# Patient Record
Sex: Male | Born: 1955
Health system: Southern US, Academic
[De-identification: ages and names within clinical notes are randomized; demographics above are authoritative.]

## PROBLEM LIST (undated history)

## (undated) ENCOUNTER — Encounter

## (undated) ENCOUNTER — Encounter: Attending: Internal Medicine | Primary: Internal Medicine

## (undated) ENCOUNTER — Telehealth

## (undated) ENCOUNTER — Ambulatory Visit

## (undated) ENCOUNTER — Encounter: Attending: Urology | Primary: Urology

## (undated) ENCOUNTER — Telehealth: Attending: Children | Primary: Children

## (undated) ENCOUNTER — Ambulatory Visit: Payer: MEDICARE

## (undated) ENCOUNTER — Encounter: Attending: Family | Primary: Family

## (undated) ENCOUNTER — Encounter: Attending: Children | Primary: Children

## (undated) ENCOUNTER — Encounter: Attending: Nurse Practitioner | Primary: Nurse Practitioner

## (undated) ENCOUNTER — Encounter
Attending: Pharmacist Clinician (PhC)/ Clinical Pharmacy Specialist | Primary: Pharmacist Clinician (PhC)/ Clinical Pharmacy Specialist

## (undated) ENCOUNTER — Other Ambulatory Visit

## (undated) ENCOUNTER — Ambulatory Visit: Payer: MEDICARE | Attending: Internal Medicine | Primary: Internal Medicine

## (undated) ENCOUNTER — Telehealth: Attending: Medical Oncology | Primary: Medical Oncology

## (undated) ENCOUNTER — Telehealth: Attending: Internal Medicine | Primary: Internal Medicine

## (undated) ENCOUNTER — Ambulatory Visit
Attending: Pharmacist Clinician (PhC)/ Clinical Pharmacy Specialist | Primary: Pharmacist Clinician (PhC)/ Clinical Pharmacy Specialist

## (undated) ENCOUNTER — Encounter
Attending: Student in an Organized Health Care Education/Training Program | Primary: Student in an Organized Health Care Education/Training Program

## (undated) ENCOUNTER — Ambulatory Visit: Payer: MEDICARE | Attending: Family | Primary: Family

## (undated) ENCOUNTER — Ambulatory Visit: Attending: Urology | Primary: Urology

## (undated) ENCOUNTER — Encounter: Attending: Registered" | Primary: Registered"

## (undated) DIAGNOSIS — C801 Malignant (primary) neoplasm, unspecified: Secondary | ICD-10-CM

## (undated) DIAGNOSIS — F32A Depression, unspecified: Secondary | ICD-10-CM

## (undated) DIAGNOSIS — J45909 Unspecified asthma, uncomplicated: Secondary | ICD-10-CM

## (undated) DIAGNOSIS — G8929 Other chronic pain: Secondary | ICD-10-CM

## (undated) DIAGNOSIS — T753XXA Motion sickness, initial encounter: Secondary | ICD-10-CM

## (undated) DIAGNOSIS — M25512 Pain in left shoulder: Secondary | ICD-10-CM

## (undated) DIAGNOSIS — M25551 Pain in right hip: Secondary | ICD-10-CM

## (undated) DIAGNOSIS — I219 Acute myocardial infarction, unspecified: Secondary | ICD-10-CM

## (undated) DIAGNOSIS — I10 Essential (primary) hypertension: Secondary | ICD-10-CM

## (undated) DIAGNOSIS — D649 Anemia, unspecified: Secondary | ICD-10-CM

## (undated) DIAGNOSIS — R011 Cardiac murmur, unspecified: Secondary | ICD-10-CM

## (undated) DIAGNOSIS — A159 Respiratory tuberculosis unspecified: Secondary | ICD-10-CM

## (undated) DIAGNOSIS — F329 Major depressive disorder, single episode, unspecified: Secondary | ICD-10-CM

## (undated) DIAGNOSIS — E785 Hyperlipidemia, unspecified: Secondary | ICD-10-CM

## (undated) DIAGNOSIS — K219 Gastro-esophageal reflux disease without esophagitis: Secondary | ICD-10-CM

## (undated) DIAGNOSIS — R7303 Prediabetes: Secondary | ICD-10-CM

## (undated) DIAGNOSIS — M1990 Unspecified osteoarthritis, unspecified site: Secondary | ICD-10-CM

## (undated) DIAGNOSIS — G4730 Sleep apnea, unspecified: Secondary | ICD-10-CM

## (undated) HISTORY — DX: Hyperlipidemia, unspecified: E78.5

## (undated) HISTORY — PX: TONSILECTOMY, ADENOIDECTOMY, BILATERAL MYRINGOTOMY AND TUBES: SHX2538

## (undated) HISTORY — DX: Essential (primary) hypertension: I10

## (undated) HISTORY — PX: CLAVICLE EXCISION: SUR503

## (undated) HISTORY — PX: TONSILLECTOMY: SUR1361

## (undated) MED ORDER — B-COMPLEX WITH VITAMIN C TABLET: ORAL | 0 days

---

## 2017-05-02 DIAGNOSIS — C801 Malignant (primary) neoplasm, unspecified: Secondary | ICD-10-CM

## 2017-05-02 HISTORY — DX: Malignant (primary) neoplasm, unspecified: C80.1

## 2017-05-16 ENCOUNTER — Other Ambulatory Visit: Payer: Self-pay

## 2017-06-02 ENCOUNTER — Encounter: Payer: Self-pay | Admitting: Urology

## 2017-06-02 ENCOUNTER — Ambulatory Visit (INDEPENDENT_AMBULATORY_CARE_PROVIDER_SITE_OTHER): Payer: Federal, State, Local not specified - PPO | Admitting: Urology

## 2017-06-02 VITALS — BP 123/68 | HR 81 | Ht 73.0 in | Wt 207.4 lb

## 2017-06-02 DIAGNOSIS — R972 Elevated prostate specific antigen [PSA]: Secondary | ICD-10-CM

## 2017-06-02 NOTE — Progress Notes (Signed)
06/02/2017 2:51 PM   Brendan Larson [birth year] [id number]  Referring provider: Maryland Pink, MD [address] [facility] [place], [place] [address]  Chief Complaint  Patient presents with  . Elevated PSA    HPI: The patient is a 62 year old gentleman who for evaluation of elevated PSA at 46.54 in January 2019.  He believes his PSA was approved around warned about 8 years ago.  He has no family history of prostate cancer.  He is never had a prostate biopsy before.  I PSS score is 9/2.  Not interested in any medications for his urinary symptoms.  No hematuria, UTIs, or nephrolithiasis.   PMH: Past Medical History:  Diagnosis Date  . Hyperlipidemia   . Hypertension     Surgical History: None  Home Medications:  Allergies as of 06/02/2017      Reactions   Penicillins Rash      Medication List        Accurate as of 06/02/17  2:51 PM. Always use your most recent med list.          lisinopril-hydrochlorothiazide 10-12.5 MG tablet Commonly known as:  PRINZIDE,ZESTORETIC Take by mouth.   multivitamin capsule Take by mouth.   nystatin-triamcinolone cream Commonly known as:  MYCOLOG II Apply topically.   omega-3 acid ethyl esters 1 g capsule Commonly known as:  LOVAZA Take by mouth 2 (two) times daily.   omeprazole 20 MG capsule Commonly known as:  PRILOSEC Take by mouth.   rosuvastatin 10 MG tablet Commonly known as:  CRESTOR Take by mouth.       Allergies:  Allergies  Allergen Reactions  . Penicillins Rash    Family History: Family History  Problem Relation Age of Onset  . Prostate cancer Neg Hx   . Bladder Cancer Neg Hx   . Kidney cancer Neg Hx     Social History:  reports that he has been smoking.  he has never used smokeless tobacco. He reports that he drinks alcohol. He reports that he does not use drugs.  ROS: UROLOGY Frequent Urination?: No Hard to postpone urination?: No Burning/pain with urination?: No Get up at night to  urinate?: Yes Leakage of urine?: No Urine stream starts and stops?: No Trouble starting stream?: Yes Do you have to strain to urinate?: No Blood in urine?: No Urinary tract infection?: No Sexually transmitted disease?: No Injury to kidneys or bladder?: No Painful intercourse?: No Weak stream?: Yes Erection problems?: No Penile pain?: No  Gastrointestinal Nausea?: No Vomiting?: No Indigestion/heartburn?: No Diarrhea?: No Constipation?: No  Constitutional Fever: No Night sweats?: No Weight loss?: No Fatigue?: Yes  Skin Skin rash/lesions?: Yes Itching?: No  Eyes Blurred vision?: No Double vision?: No  Ears/Nose/Throat Sore throat?: No Sinus problems?: No  Hematologic/Lymphatic Swollen glands?: No Easy bruising?: No  Cardiovascular Leg swelling?: No Chest pain?: No  Respiratory Cough?: Yes Shortness of breath?: No  Endocrine Excessive thirst?: No  Musculoskeletal Back pain?: No Joint pain?: Yes  Neurological Headaches?: No Dizziness?: Yes  Psychologic Depression?: No Anxiety?: No  Physical Exam: BP 123/68 (BP Location: Right Arm, Patient Position: Sitting, Cuff Size: Large)   Pulse 81   Ht 6\' 1"  (1.854 m)   Wt 207 lb 6.4 oz (94.1 kg)   BMI 27.36 kg/m   Constitutional:  Alert and oriented, No acute distress. HEENT: [place] AT, moist mucus membranes.  Trachea midline, no masses. Cardiovascular: No clubbing, cyanosis, or edema. Respiratory: Normal respiratory effort, no increased work of breathing. GI:  Abdomen is soft, nontender, nondistended, no abdominal masses GU: No CVA tenderness.  Normal phallus.  Testicles descended bilaterally benign.  DRE: 2+.  There is a nodule on the left lateral apex.  Is approximately 1-2 cm in size. Skin: No rashes, bruises or suspicious lesions. Lymph: No cervical or inguinal adenopathy. Neurologic: Grossly intact, no focal deficits, moving all 4 extremities. Psychiatric: Normal mood and affect.  Laboratory  Data: No results found for: WBC, HGB, HCT, MCV, PLT  No results found for: CREATININE  No results found for: PSA  No results found for: TESTOSTERONE  No results found for: HGBA1C  Urinalysis No results found for: COLORURINE, APPEARANCEUR, LABSPEC, PHURINE, GLUCOSEU, HGBUR, BILIRUBINUR, KETONESUR, PROTEINUR, UROBILINOGEN, NITRITE, LEUKOCYTESUR   Assessment & Plan:    1. Elevated PSA with abnormal DRE I discussed with the patient that he has both a markedly elevated PSA as well as an abnormal digital rectal exam.  His DRE was not is abnormal is at expected to be for how high his PSA is however there clearly is a nodule.  We did discuss the next step would be a prostate biopsy.  We discussed the risk, benefits, and indications of this procedure.  He understands the risks include but are not limited to bleeding and infection.  He knows to expect both blood in his stool and urine for up to 48 hours and semen for 6 weeks.  He understands the risk of hospitalization with sepsis despite periprocedure antibiotics of approximately 1%.  The patient is requesting that we repeat his PSA.  I offered him this test.  We did discuss however that even if his PSA normalizes that he should still undergo a biopsy due to his abnormal digital rectal exam.  No Follow-up on file.  [name], MD  [facility] [address], [place], [place] [address] [phone]

## 2017-06-03 LAB — PSA: Prostate Specific Ag, Serum: 68.9 ng/mL — ABNORMAL HIGH (ref 0.0–4.0)

## 2017-06-12 ENCOUNTER — Telehealth: Payer: Self-pay

## 2017-06-12 NOTE — Telephone Encounter (Signed)
Brendan Retort, MD  Lestine Box, LPN        Please let patient know PSA remains extremely elevated. Need to proceed with biopsy. Thanks   Spoke with pt in reference to PSA and bx. Pt voiced understanding.

## 2017-06-21 ENCOUNTER — Telehealth: Payer: Self-pay

## 2017-06-21 NOTE — Telephone Encounter (Signed)
Pt called stating he took ibuprofen, 400mg , over the weekend of a headache. Pt inquired about needing to reschedule bx. Per Dr. Pilar Jarvis pt needs to reschedule. Pt voiced understanding. Bx and results appts rescheduled.

## 2017-06-22 ENCOUNTER — Other Ambulatory Visit: Payer: Self-pay

## 2017-07-06 ENCOUNTER — Encounter: Payer: Self-pay | Admitting: Urology

## 2017-07-06 ENCOUNTER — Ambulatory Visit: Payer: Self-pay

## 2017-07-06 ENCOUNTER — Ambulatory Visit: Payer: Federal, State, Local not specified - PPO | Admitting: Urology

## 2017-07-06 ENCOUNTER — Other Ambulatory Visit: Payer: Self-pay | Admitting: Urology

## 2017-07-06 VITALS — BP 121/76 | HR 83 | Ht 73.0 in | Wt 208.8 lb

## 2017-07-06 DIAGNOSIS — R972 Elevated prostate specific antigen [PSA]: Secondary | ICD-10-CM

## 2017-07-06 MED ORDER — LEVOFLOXACIN 500 MG PO TABS
500.0000 mg | ORAL_TABLET | Freq: Once | ORAL | Status: AC
  Administered 2017-07-06: 500 mg via ORAL

## 2017-07-06 MED ORDER — LIDOCAINE HCL 2 % EX GEL
1.0000 "application " | Freq: Once | CUTANEOUS | Status: AC
  Administered 2017-07-06: 1 via URETHRAL

## 2017-07-06 MED ORDER — GENTAMICIN SULFATE 40 MG/ML IJ SOLN
80.0000 mg | Freq: Once | INTRAMUSCULAR | Status: AC
  Administered 2017-07-06: 80 mg via INTRAMUSCULAR

## 2017-07-06 NOTE — Progress Notes (Signed)
Prostate Biopsy Procedure   Informed consent was obtained after discussing risks/benefits of the procedure.  A time out was performed to ensure correct patient identity.  Pre-Procedure: - Last PSA Level: 68.89 - Gentamicin given prophylactically - Levaquin 500 mg administered PO -Transrectal Ultrasound performed revealing a 38 gm prostate -No significant hypoechoic or median lobe noted  Procedure: - Prostate block performed using 10 cc 1% lidocaine and biopsies taken from sextant areas, a total of 12 under ultrasound guidance.  Post-Procedure: - Patient tolerated the procedure well - He was counseled to seek immediate medical attention if experiences any severe pain, significant bleeding, or fevers - Return in one week to discuss biopsy results

## 2017-07-13 ENCOUNTER — Other Ambulatory Visit: Payer: Self-pay | Admitting: Urology

## 2017-07-13 ENCOUNTER — Ambulatory Visit: Payer: Self-pay | Admitting: Urology

## 2017-07-13 LAB — PATHOLOGY REPORT

## 2017-07-21 ENCOUNTER — Encounter: Payer: Self-pay | Admitting: Urology

## 2017-07-21 ENCOUNTER — Ambulatory Visit (INDEPENDENT_AMBULATORY_CARE_PROVIDER_SITE_OTHER): Payer: Federal, State, Local not specified - PPO | Admitting: Urology

## 2017-07-21 VITALS — BP 115/66 | HR 71 | Resp 16 | Ht 72.5 in | Wt 210.1 lb

## 2017-07-21 DIAGNOSIS — C61 Malignant neoplasm of prostate: Secondary | ICD-10-CM

## 2017-07-21 NOTE — Progress Notes (Signed)
07/21/2017 12:16 PM   Brendan Larson 07/12/1955 [id number]  Referring provider: Maryland Pink, MD [address] [facility] [place], [place] [address]  Chief Complaint  Patient presents with  . Follow-up    HPI: The patient is 62 year old gentleman presents today for prostate biopsy results.  Unfortunately, he had Gleason 4+3 = 7 in 6 of 12 cores all on the left side.  His PSA at time of diagnosis was 68.9.  His DRE did have a nodule on the left side that was approximately 1-2 cm.  Pathology: Gleason 4+3 = 7 prostate cancer in 1 core at LLA (74%) Gleason 3+4 = 7 prostate in 5 cores in remainder of left prostate (42%-92%) PSA at time of diagnosis: 68.9 DRE: 1-2 cm nodule left apex  Patient with no urological complaints currently.  He voids well with a good stream.  He has no problems with erectile dysfunction.  He has no bone pain particularly in the spine or pelvis.   PMH: Past Medical History:  Diagnosis Date  . Hyperlipidemia   . Hypertension     Surgical History: Past Surgical History:  Procedure Laterality Date  . TONSILECTOMY, ADENOIDECTOMY, BILATERAL MYRINGOTOMY AND TUBES      Home Medications:  Allergies as of 07/21/2017      Reactions   Penicillins Rash      Medication List        Accurate as of 07/21/17 12:16 PM. Always use your most recent med list.          lisinopril-hydrochlorothiazide 10-12.5 MG tablet Commonly known as:  PRINZIDE,ZESTORETIC Take by mouth.   multivitamin capsule Take by mouth.   nystatin-triamcinolone cream Commonly known as:  MYCOLOG II Apply topically.   omega-3 acid ethyl esters 1 g capsule Commonly known as:  LOVAZA Take by mouth 2 (two) times daily.   omeprazole 20 MG capsule Commonly known as:  PRILOSEC Take by mouth.   pantoprazole 40 MG tablet Commonly known as:  PROTONIX Take by mouth.   rosuvastatin 10 MG tablet Commonly known as:  CRESTOR Take by mouth.       Allergies:  Allergies    Allergen Reactions  . Penicillins Rash    Family History: Family History  Problem Relation Age of Onset  . Prostate cancer Neg Hx   . Bladder Cancer Neg Hx   . Kidney cancer Neg Hx     Social History:  reports that he has been smoking.  He has never used smokeless tobacco. He reports that he drinks alcohol. He reports that he does not use drugs.  ROS:                                        Physical Exam: BP 115/66   Pulse 71   Resp 16   Ht 6' 0.5" (1.842 m)   Wt 210 lb 1.6 oz (95.3 kg)   SpO2 98%   BMI 28.10 kg/m   Constitutional:  Alert and oriented, No acute distress. HEENT: [place] AT, moist mucus membranes.  Trachea midline, no masses. Cardiovascular: No clubbing, cyanosis, or edema. Respiratory: Normal respiratory effort, no increased work of breathing. GI: Abdomen is soft, nontender, nondistended, no abdominal masses GU: No CVA tenderness.  Skin: No rashes, bruises or suspicious lesions. Lymph: No cervical or inguinal adenopathy. Neurologic: Grossly intact, no focal deficits, moving all 4 extremities. Psychiatric: Normal mood and affect.  Laboratory Data: No results found for: WBC, HGB, HCT, MCV, PLT  No results found for: CREATININE  No results found for: PSA  No results found for: TESTOSTERONE  No results found for: HGBA1C  Urinalysis No results found for: COLORURINE, APPEARANCEUR, LABSPEC, PHURINE, GLUCOSEU, HGBUR, BILIRUBINUR, KETONESUR, PROTEINUR, UROBILINOGEN, NITRITE, LEUKOCYTESUR   Assessment & Plan:    1.  High risk prostate cancer I discussed with the patient that he has a new diagnosis of high risk prostate cancer.  This is being driven by his PSA of 68.9.  We did discuss the next step would be imaging with CT scan and bone scan to determine if he has metastatic disease.  We did discuss the algorithm in great detail based on imaging results.  If he does not have sign of metastatic disease, he would be a candidate for either  robotic prostatectomy or radiation therapy.  We discussed these treatment modalities in great detail.  We also discussed the side effects of each treatment option.  If he does have metastatic disease, we did discuss androgen deprivation therapy as first-line treatment.  We discussed the risks and benefits of this.  We discussed the side effects of androgen deprivation therapy including osteoporosis, fatigue, muscle loss, decreased libido, and erectile dysfunction.  All questions were answered.  The patient will follow-up after undergoing imaging studies.  Return for after CT and bone scan.  [name], MD  [facility] [address] [place], [place] [address] [phone]

## 2017-07-28 ENCOUNTER — Ambulatory Visit
Admission: RE | Admit: 2017-07-28 | Discharge: 2017-07-28 | Disposition: A | Discharge status: Home / Self Care | Payer: Federal, State, Local not specified - PPO | Source: Ambulatory Visit | Attending: Urology | Admitting: Urology

## 2017-07-28 ENCOUNTER — Encounter
Admission: RE | Admit: 2017-07-28 | Discharge: 2017-07-28 | Disposition: A | Discharge status: Home / Self Care | Payer: Federal, State, Local not specified - PPO | Source: Ambulatory Visit | Attending: Urology | Admitting: Urology

## 2017-07-28 DIAGNOSIS — G8929 Other chronic pain: Secondary | ICD-10-CM

## 2017-07-28 DIAGNOSIS — K760 Fatty (change of) liver, not elsewhere classified: Secondary | ICD-10-CM | POA: Diagnosis not present

## 2017-07-28 DIAGNOSIS — K8020 Calculus of gallbladder without cholecystitis without obstruction: Secondary | ICD-10-CM | POA: Insufficient documentation

## 2017-07-28 DIAGNOSIS — C61 Malignant neoplasm of prostate: Secondary | ICD-10-CM

## 2017-07-28 DIAGNOSIS — M899 Disorder of bone, unspecified: Secondary | ICD-10-CM | POA: Insufficient documentation

## 2017-07-28 DIAGNOSIS — M25551 Pain in right hip: Secondary | ICD-10-CM

## 2017-07-28 DIAGNOSIS — M25512 Pain in left shoulder: Secondary | ICD-10-CM

## 2017-07-28 HISTORY — DX: Pain in right hip: M25.551

## 2017-07-28 HISTORY — DX: Other chronic pain: G89.29

## 2017-07-28 HISTORY — DX: Pain in left shoulder: M25.512

## 2017-07-28 LAB — POCT I-STAT CREATININE: Creatinine, Ser: 0.9 mg/dL (ref 0.61–1.24)

## 2017-07-28 IMAGING — NM NM BONE WHOLE BODY
2 series · 12 of 12 positions shown · non-contrast
Comparison: CT [DATE]

CLINICAL DATA: Prostate cancer

EXAM:
NUCLEAR MEDICINE WHOLE BODY BONE SCAN
TECHNIQUE: Whole body anterior and posterior images were obtained approximately
3 hours after intravenous injection of radiopharmaceutical.
RADIOPHARMACEUTICALS:  22.89 mCi [KY] MDP IV

[Series 1000: statics · 2.40mm/px · 5 acquisitions, 10 frames shown]
[im 1/5]
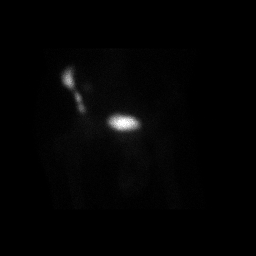
[im 1/5]
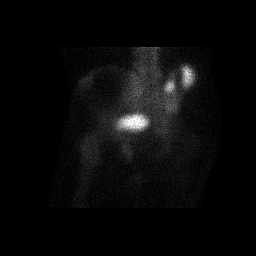
[im 2/5]
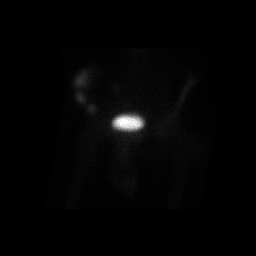
[im 2/5]
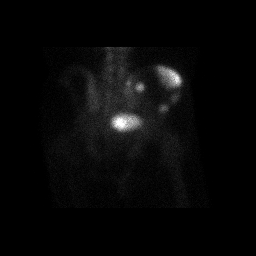
[im 3/5]
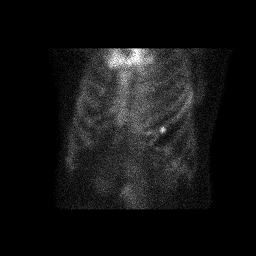
[im 3/5]
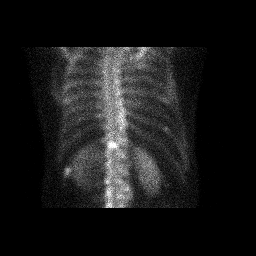
[im 4/5]
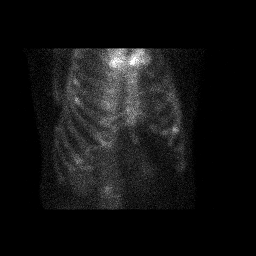
[im 4/5]
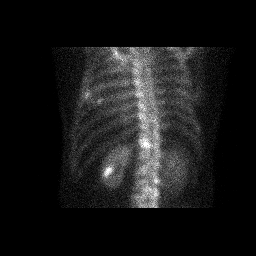
[im 5/5]
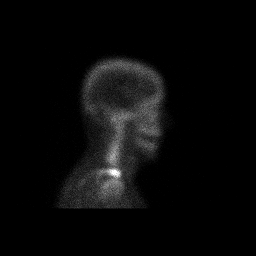
[im 5/5]
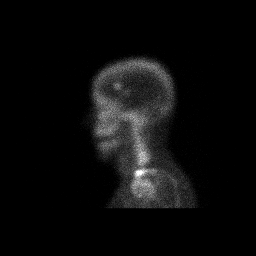

[Series 1000: 3 hr wholebody · 2.40mm/px · 2 of 2 frames shown]
[frame 1/2]
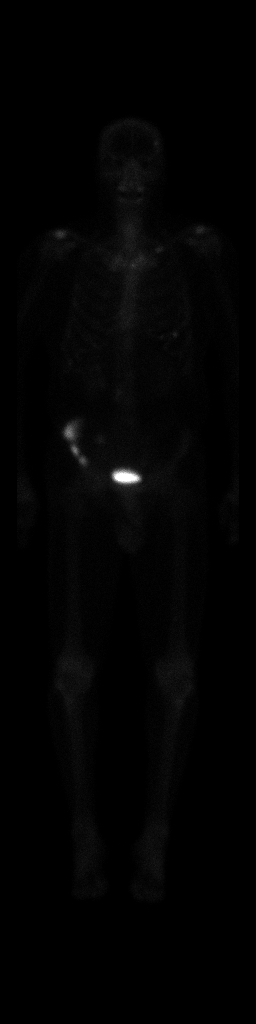
[frame 2/2]
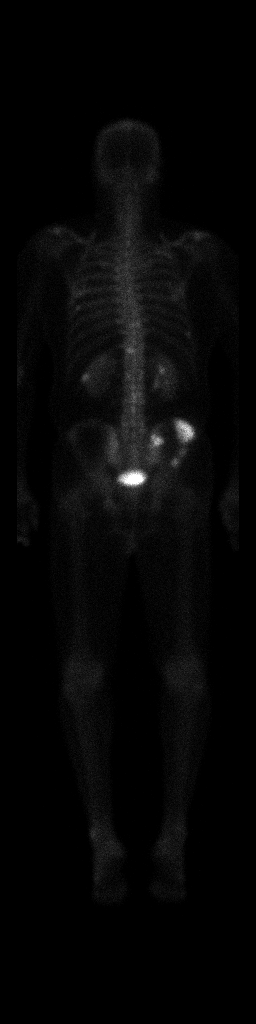

[12 of 12 positions shown; findings below may reference images not displayed]

FINDINGS: Small focus of left calvarial activity. Mild right greater than left
AC joint activity possibly degenerative. Mild upper sternal
activity. Multiple bilateral foci of rib activity, on the left at
the seventh, eighth, and twelfth ribs, and on the right at the tenth
and fourth posterior ribs. Small focus of activity in the region of
medial left clavicle versus first rib. Small foci of activity within
the lower thoracic spine. Intense multiple foci of activity in the
right iliac bone, and adjacent to the SI joint corresponding to CT
lesions. Physiologic renal and bladder activity. Mild degenerative
activity at the left ankle.
IMPRESSION: 1. Multiple foci of osseous activity involving the right pelvis,
bilateral ribs, spine, sternum and left calvarium consistent with
osseous metastatic disease. Small focus of activity either involving
the left anterior first rib or medial left clavicle.
2. Probable degenerative activity at the AC joints and left ankle.

## 2017-07-28 IMAGING — CT CT ABD-PELV W/ CM
2 of 5 series · 15 of 46 positions shown, 17 images · IV contrast (APPLIED)
Comparison: None.

CLINICAL DATA: Prostate cancer.

EXAM:
CT ABDOMEN AND PELVIS WITH CONTRAST
TECHNIQUE: Multidetector CT imaging of the abdomen and pelvis was performed
using the standard protocol following bolus administration of
intravenous contrast.
CONTRAST:  100mL [ZI] IOPAMIDOL ([ZI]) INJECTION 61%

[Series 2: routine abd/pel with · axial · 0.76mm/px · z∈[-1115,-635]mm · 12 of 109 slices shown, 14 images]
[im 7/109  soft-tissue]
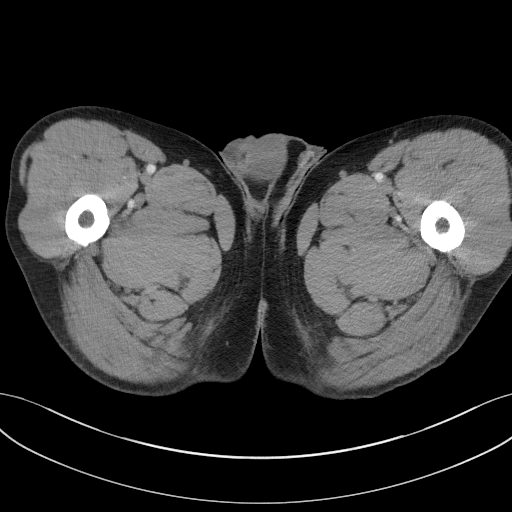
[im 7/109  bone]
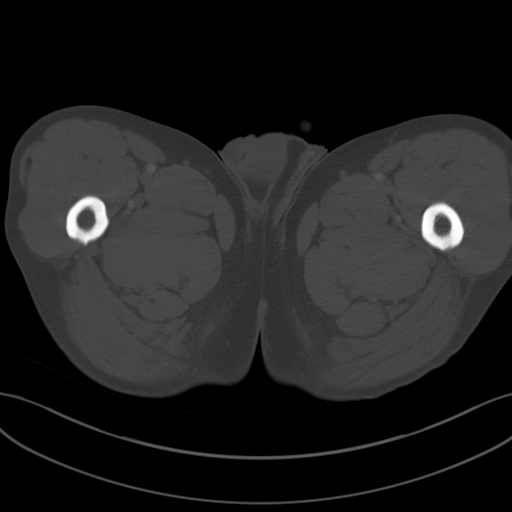
[im 19/109  soft-tissue]
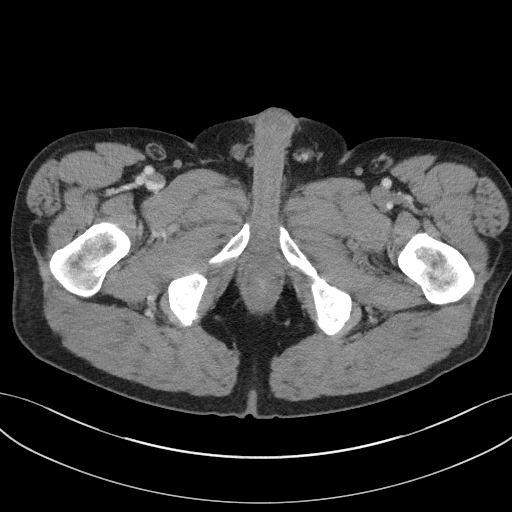
[im 25/109  soft-tissue]
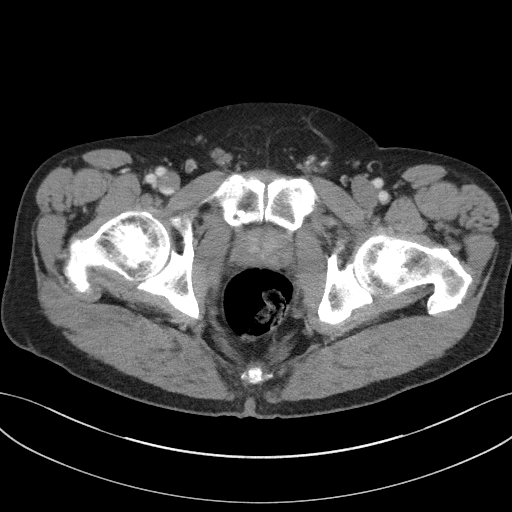
[im 31/109  soft-tissue]
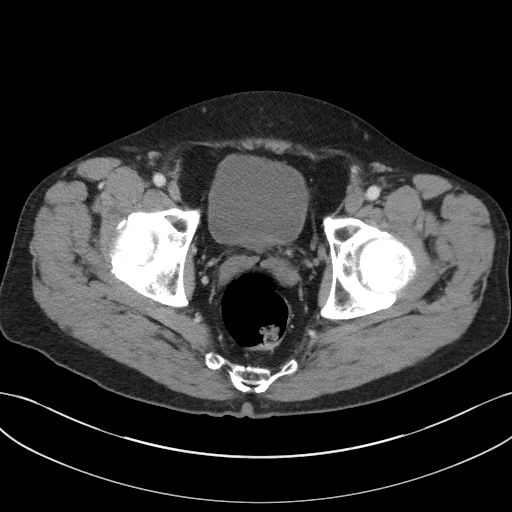
[im 43/109  soft-tissue]
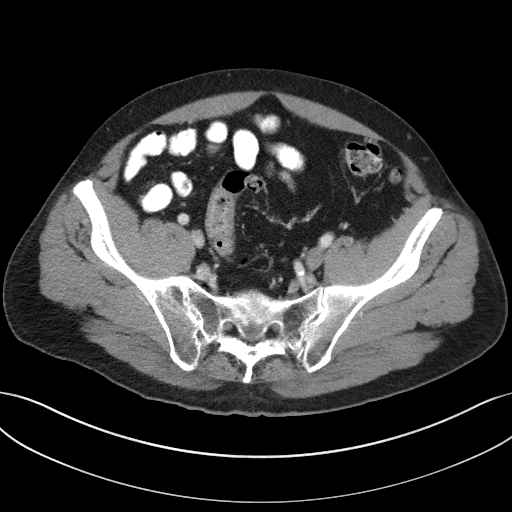
[im 49/109  soft-tissue]
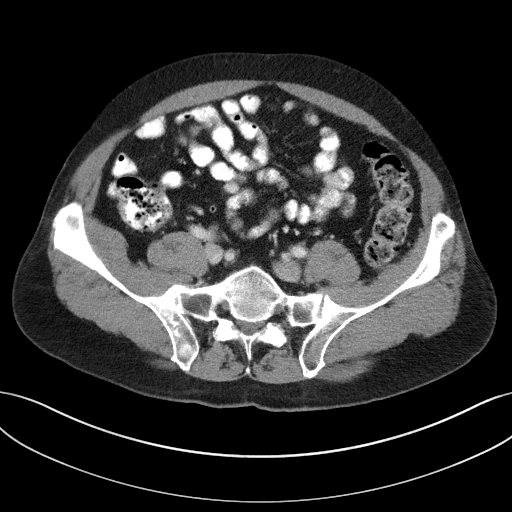
[im 61/109  soft-tissue]
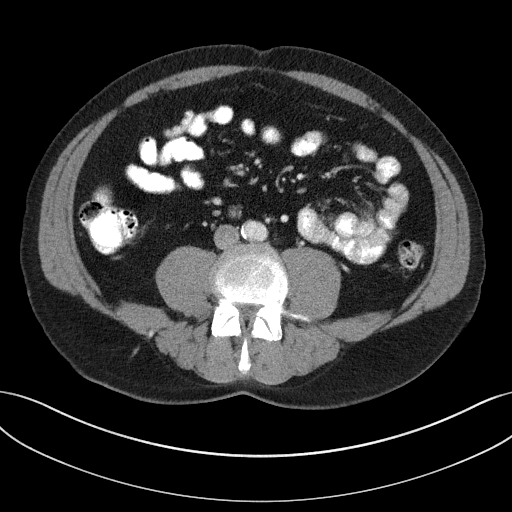
[im 67/109  soft-tissue]
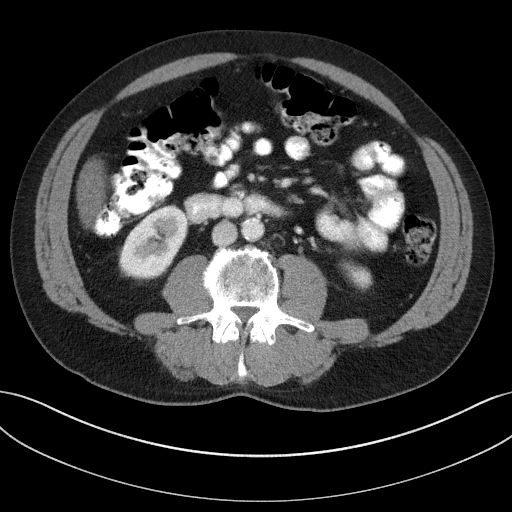
[im 79/109  soft-tissue]
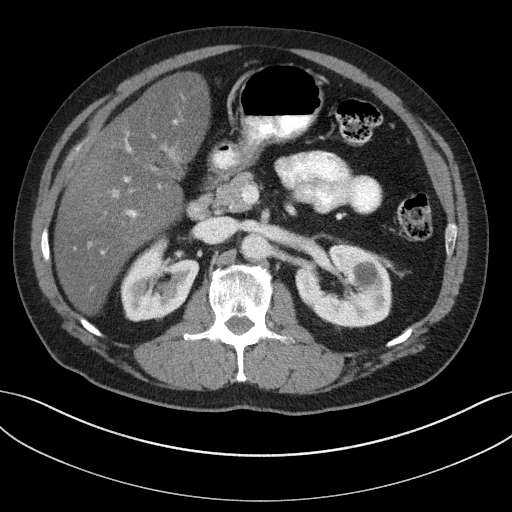
[im 79/109  bone]
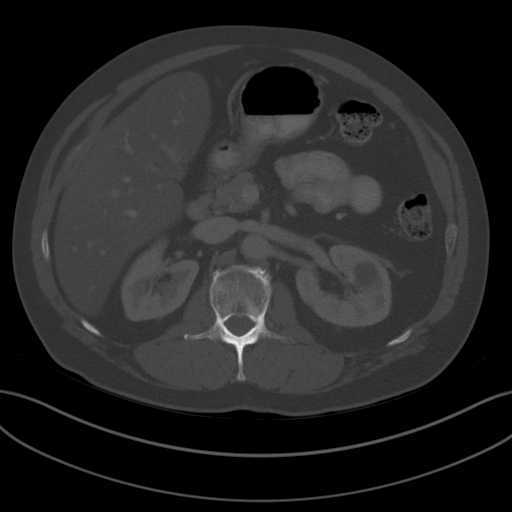
[im 85/109  soft-tissue]
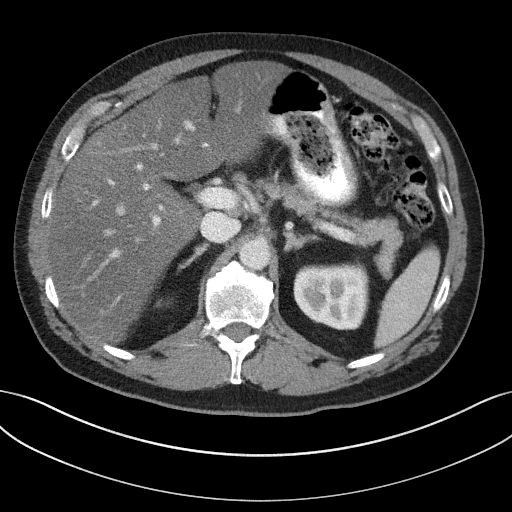
[im 91/109  soft-tissue]
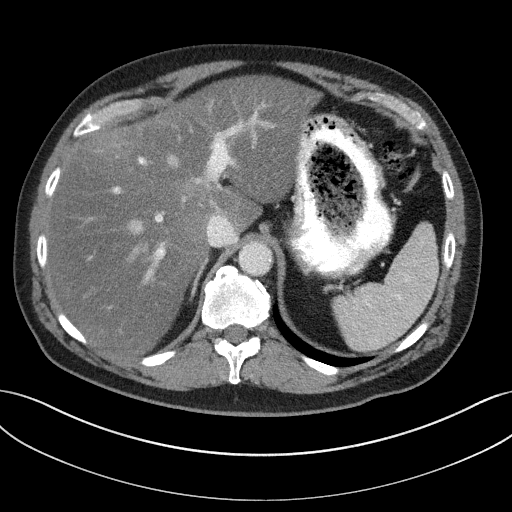
[im 103/109  soft-tissue]
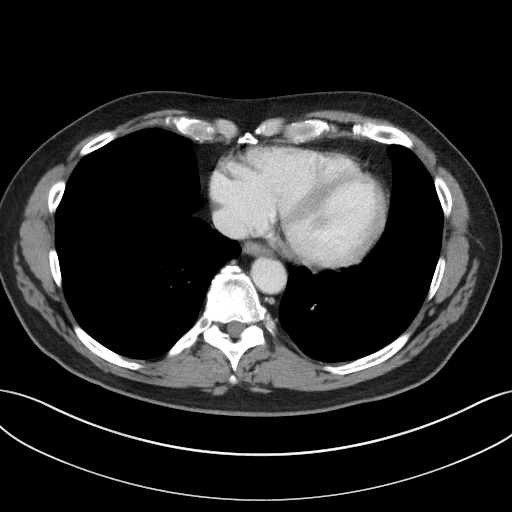

[Series 5: coronal st · coronal · 0.77mm/px · 3 of 84 slices shown]
[im 28/84  soft-tissue]
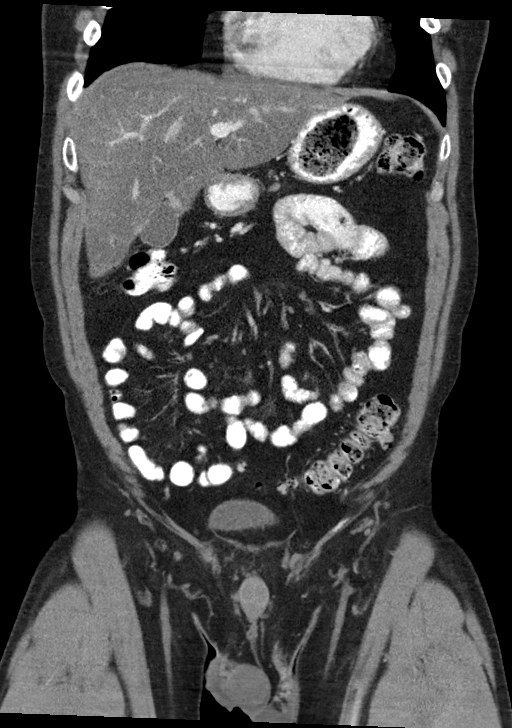
[im 37/84  soft-tissue]
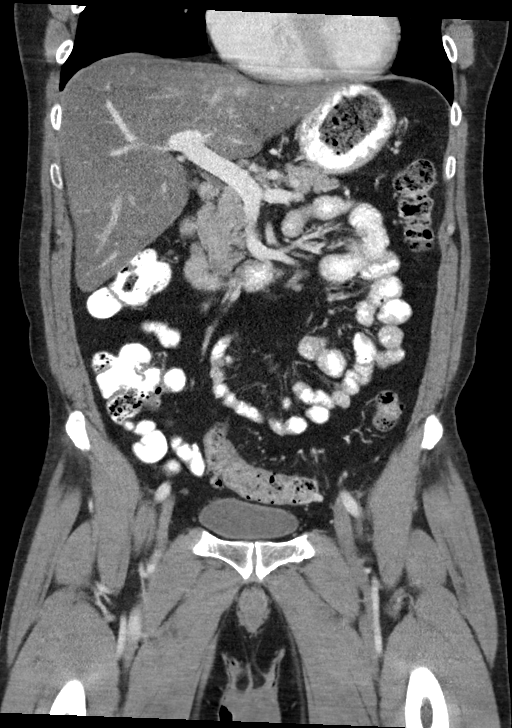
[im 47/84  soft-tissue]
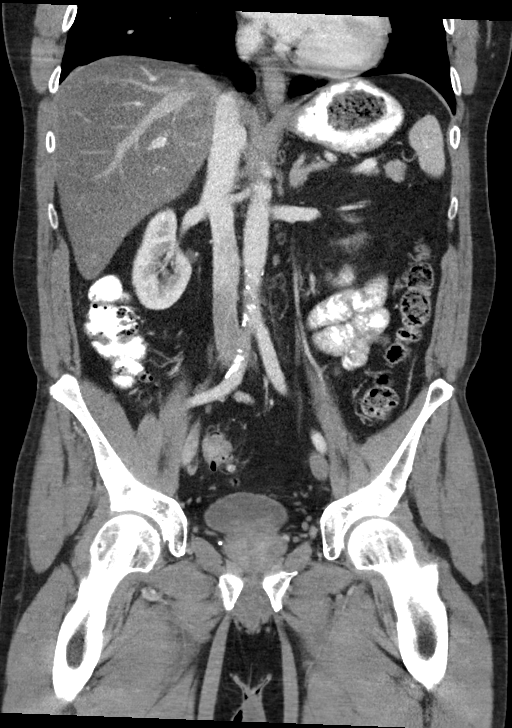

[15 of 46 positions shown; findings below may reference images not displayed]

FINDINGS: Lower chest: The lung bases are clear of acute process. No pleural
effusion or pulmonary lesions. The heart is normal in size. No
pericardial effusion. The distal esophagus and aorta are
unremarkable.

Hepatobiliary: Diffuse and marked fatty infiltration of the liver.
No focal hepatic lesions or intrahepatic biliary dilatation. The
portal and hepatic veins are patent. Small gallstones are noted in
the gallbladder but no findings for acute cholecystitis. No common
bile duct dilatation.

Pancreas: No mass, inflammation or ductal dilatation.

Spleen: Normal size.  No focal lesions.

Adrenals/Urinary Tract: The adrenal glands and kidneys are
unremarkable. Renal cysts are noted. No worrisome renal lesions. No
hydronephrosis. No ureteral or bladder calculi. No bladder mass. The
delayed images do not demonstrate any significant collecting system
abnormalities.

Stomach/Bowel: The stomach, duodenum, small bowel and colon are
unremarkable. No acute inflammatory changes, mass lesions or
obstructive findings. The terminal ileum is normal. The appendix is
normal. Sigmoid diverticulosis without findings for acute
diverticulitis.

Vascular/Lymphatic: Scattered atherosclerotic calcifications
involving the aorta and iliac arteries. No aneurysm or dissection.
The branch vessels are patent. The major venous structures are
patent.

No mesenteric or retroperitoneal mass or adenopathy. Small scattered
lymph nodes are noted. No pelvic adenopathy. No inguinal adenopathy.

Reproductive: The prostate gland and seminal vesicles are
unremarkable.

Other: No pelvic mass or adenopathy. No free pelvic fluid
collections. No inguinal mass or adenopathy. No abdominal wall
hernia or subcutaneous lesions.

Musculoskeletal: Small sclerotic scattered bone lesions and a more
worrisome area involving the right iliac crest. Findings quite
suspicious for osseous metastatic disease. Recommend correlation
with pending bone scan.
IMPRESSION: 1. Scattered small sclerotic bone lesions and a large area of
sclerosis involving the right iliac crest. Findings suspicious for
osseous metastatic disease. Recommend correlation with pending bone
scan.
2. No abdominal/pelvic lymphadenopathy or evidence of
intra-abdominal/pelvic metastatic disease.
3. Diffuse and marked fatty infiltration of the liver.
4. Cholelithiasis.

## 2017-07-28 MED ORDER — IOPAMIDOL (ISOVUE-300) INJECTION 61%
100.0000 mL | Freq: Once | INTRAVENOUS | Status: AC | PRN
  Administered 2017-07-28: 100 mL via INTRAVENOUS

## 2017-07-28 MED ORDER — TECHNETIUM TC 99M MEDRONATE IV KIT
22.8900 | PACK | Freq: Once | INTRAVENOUS | Status: AC | PRN
  Administered 2017-07-28: 22.89 via INTRAVENOUS

## 2017-08-03 ENCOUNTER — Ambulatory Visit (INDEPENDENT_AMBULATORY_CARE_PROVIDER_SITE_OTHER): Payer: Federal, State, Local not specified - PPO | Admitting: Urology

## 2017-08-03 ENCOUNTER — Encounter: Payer: Self-pay | Admitting: Urology

## 2017-08-03 VITALS — BP 151/80 | HR 71 | Resp 16 | Ht 72.5 in | Wt 210.0 lb

## 2017-08-03 DIAGNOSIS — C61 Malignant neoplasm of prostate: Secondary | ICD-10-CM

## 2017-08-03 DIAGNOSIS — C7951 Secondary malignant neoplasm of bone: Secondary | ICD-10-CM | POA: Diagnosis not present

## 2017-08-04 ENCOUNTER — Encounter: Payer: Self-pay | Admitting: Urology

## 2017-08-04 NOTE — Progress Notes (Signed)
08/03/2017 6:55 AM   Brendan Larson 03/02/1956 [id number]  Referring provider: Maryland Pink, MD [address] [place],  [address]  Chief complaint: Follow-up after bone scan/CT scan  HPI: 62 year old male previously seen by Brendan Larson for a PSA of 68.9 and left apical nodule on DRE.  Prostate volume was 38 g.  6/12 cores positive for high volume adenocarcinoma the prostate primarily Gleason 3+4 however the left lateral core showed Gleason 4+3 adenocarcinoma.  Bone scan performed on 07/28/2017 showed multiple foci of osseous activity involving the right pelvis, bilateral ribs, spine, sternum and left calvarium consistent with metastatic disease.  CT of the abdomen and pelvis showed sclerotic bone lesions involving the right iliac crest suspicious for metastatic disease.  No pelvic or retroperitoneal adenopathy was identified.   PMH: Past Medical History:  Diagnosis Date  . Chronic left shoulder pain 07/28/2017  . Hip pain, chronic, right 07/28/2017  . Hyperlipidemia   . Hypertension     Surgical History: Past Surgical History:  Procedure Laterality Date  . TONSILECTOMY, ADENOIDECTOMY, BILATERAL MYRINGOTOMY AND TUBES      Home Medications:  Allergies as of 08/03/2017      Reactions   Penicillins Rash      Medication List        Accurate as of 08/03/17 11:59 PM. Always use your most recent med list.          lisinopril-hydrochlorothiazide 10-12.5 MG tablet Commonly known as:  PRINZIDE,ZESTORETIC Take by mouth.   multivitamin capsule Take by mouth.   nystatin-triamcinolone cream Commonly known as:  MYCOLOG II Apply topically.   omega-3 acid ethyl esters 1 g capsule Commonly known as:  LOVAZA Take by mouth 2 (two) times daily.   omeprazole 20 MG capsule Commonly known as:  PRILOSEC Take by mouth.   pantoprazole 40 MG tablet Commonly known as:  PROTONIX Take by mouth.   rosuvastatin 10 MG tablet Commonly known as:   CRESTOR Take by mouth.       Allergies:  Allergies  Allergen Reactions  . Penicillins Rash    Family History: Family History  Problem Relation Age of Onset  . Prostate cancer Neg Hx   . Bladder Cancer Neg Hx   . Kidney cancer Neg Hx     Social History:  reports that he has been smoking.  He has never used smokeless tobacco. He reports that he drinks alcohol. He reports that he does not use drugs.  ROS: UROLOGY Frequent Urination?: No Hard to postpone urination?: No Burning/pain with urination?: No Get up at night to urinate?: Yes Leakage of urine?: No Urine stream starts and stops?: No Trouble starting stream?: No Do you have to strain to urinate?: No Blood in urine?: No Urinary tract infection?: No Sexually transmitted disease?: No Injury to kidneys or bladder?: No Painful intercourse?: No Weak stream?: No Erection problems?: No Penile pain?: No  Gastrointestinal Nausea?: No Vomiting?: No Indigestion/heartburn?: No Diarrhea?: No Constipation?: No  Constitutional Fever: No Night sweats?: No Weight loss?: No Fatigue?: Yes  Skin Skin rash/lesions?: No Itching?: No  Eyes Blurred vision?: No Double vision?: No  Ears/Nose/Throat Sore throat?: No Sinus problems?: No  Hematologic/Lymphatic Swollen glands?: No Easy bruising?: No  Cardiovascular Leg swelling?: No Chest pain?: No  Respiratory Cough?: No Shortness of breath?: No  Endocrine Excessive thirst?: No  Musculoskeletal Back pain?: No Joint pain?: No  Neurological Headaches?: No Dizziness?: No  Psychologic Depression?: Yes Anxiety?: No  Physical Exam: BP (!) 151/80  Pulse 71   Resp 16   Ht 6' 0.5" (1.842 m)   Wt 210 lb (95.3 kg)   SpO2 98%   BMI 28.09 kg/m   Constitutional:  Alert and oriented, No acute distress. HEENT: [place] AT, moist mucus membranes.  Trachea midline Cardiovascular: No clubbing, cyanosis, or edema. Respiratory: Normal respiratory effort, no  increased work of breathing. Skin: No rashes, bruises or suspicious lesions. Neurologic: Grossly intact, no focal deficits, moving all 4 extremities. Psychiatric: Normal mood and affect.  Laboratory Data:  Lab Results  Component Value Date   CREATININE 0.90 07/28/2017    Pertinent Imaging: CT/bone scan images personally reviewed  Assessment & Plan:   62 year old male with high risk prostate cancer and evidence of bone metastasis.  The findings were discussed in detail with Brendan Larson and his wife.  They were informed that with the presence of bone metastasis he would not be considered curable but certainly treatable.  Discussed the cornerstone of initial treatment would be androgen deprivation therapy.  Potential additional considerations include ADT plus chemotherapy and consideration of radiation to the primary tumor.  He did request referral to [facility] and wanted to see Dr. Bridgett Larson in radiation oncology.  I recommended an appointment in the multidisciplinary oncology clinic with medical and radiation oncology.  Greater than 50% of this 15-minute visit was spent counseling the patient.   Brendan Larson, [place] [address], [place], [place] [address] [phone]

## 2017-08-10 ENCOUNTER — Encounter: Admit: 2017-08-10 | Discharge: 2017-08-10 | Payer: MEDICARE

## 2017-08-10 DIAGNOSIS — C61 Malignant neoplasm of prostate: Principal | ICD-10-CM

## 2017-08-11 ENCOUNTER — Encounter: Admit: 2017-08-11 | Discharge: 2017-08-12 | Payer: MEDICARE

## 2017-08-14 ENCOUNTER — Ambulatory Visit: Admit: 2017-08-14 | Discharge: 2017-08-14 | Payer: MEDICARE

## 2017-08-14 ENCOUNTER — Encounter
Admit: 2017-08-14 | Discharge: 2017-08-14 | Payer: MEDICARE | Attending: Radiation Oncology | Primary: Radiation Oncology

## 2017-08-14 DIAGNOSIS — C61 Malignant neoplasm of prostate: Principal | ICD-10-CM

## 2017-08-14 MED ORDER — TAMSULOSIN 0.4 MG CAPSULE
ORAL_CAPSULE | Freq: Every day | ORAL | 3 refills | 0.00000 days | Status: CP
Start: 2017-08-14 — End: 2018-05-16

## 2017-08-21 ENCOUNTER — Encounter: Admit: 2017-08-21 | Discharge: 2017-08-21 | Payer: MEDICARE

## 2017-08-21 ENCOUNTER — Ambulatory Visit: Admit: 2017-08-21 | Discharge: 2017-08-21 | Payer: MEDICARE | Attending: Internal Medicine | Primary: Internal Medicine

## 2017-08-21 DIAGNOSIS — C61 Malignant neoplasm of prostate: Principal | ICD-10-CM

## 2017-08-21 DIAGNOSIS — C7951 Secondary malignant neoplasm of bone: Secondary | ICD-10-CM

## 2017-08-21 NOTE — Unmapped (Signed)
Initial Genitourinary Oncology Clinic Note    Patient Name: Curtis Harris.  Encounter Date: 08/21/2017    Referring Physician: Marilynne Drivers, MD  [address]  [place], [place] [address]  Urologist: Angela Adam  Rad onc: Imogene Burn    Assessment/Plan:    62 y.o. with HTN who presents with a new diagnosis of metastatic prostate cancer to bone. We reviewed his pathology and imaging findings demonstrating high volume osseous metastatic disease. We reviewed the incurable nature of this disease.    I then reviewed the rationale for ADT and side effects including but not limited to asthenia, fatigue, mood changes, bone loss, muscle atrophy, male menopause symptoms including hot flashes, loss of libido, gynecomastia, cardiovascular/metabolic syndrome, osteoporosis, and reduced muscle mass, as well as others. I have counseled the patient on the need to take calcium and vitamin D as well as regular weight bearing exercise to minimize the side effects of ADT.  It is expected that virtually all patients will develop progressive disease while on ADT and at that time additional treatment would be required. Ultimately, the patient may die from prostate cancer. He will start ADT today with Degarelix, and return in one month for a Lupron injection. We additionally reviewed the possible local side effects of Degarelix.    We discussed the data for either the addition of abiraterone and prednisone, enzalutamide, or docetaxel to ADT in hormone sensitive metastatic prostate cancer.    The addition of abiraterone and prednisone to ADT improves survival based on the STAMPEDE and Latitude studies with a HR for death of 0.47 (95% CI 0.39-0.55) in the Latitude trial and HR of 0.63 (95%( CI 0.52-0.76) in the Stampede trial. The addition of docetaxel to ADT improves survival based on the E3805 CHAARTED trial demonstrating a HR of 0.61 (p=0.0003). And lastly, the recently presented ARCHES trial showed a rPFS benefit of enzalutamide in this setting. Prior docetaxel was allowed in a cohort of patients. OS data was immature to show survival benefit at this time. Based on this discussion, we decided on addition of abiraterone and prednisone to ADT.    I discussed the use of abiraterone (androgen synthesis blocker via CYP17 inhibition) in combination with prednisone. Side effects of abi include cardiac disorders, hyperaldosteronism (hypokalemia, peripheral edema, HTN, CHF), and LFT abnormalities. I discussed need to take without food (take at least 2 hours after and 1 hour before meals). Will plan to check LFTS every 2 weeks for 3 months and monthly thereafter, as well as BMP at least monthly. Advised need to check BP daily at home and will check in clinic at least monthly.    Plan  - CBC, CMP, phosphorus, PSA, baseline testosterone today  - Degarelix today  - Change to Lupron in 1 month  - Start abiraterone and prednisone - Rx to be sent  - Start calcium and vitamin D  - Baseline DEXA, CT Chest  - Consented to [facility] for tumor sequencing and Ironman studies  - RTC 4 weeks    History of Present Illness:    Curtis Harris. is a 62 y.o. male who is seen in consultation at the request of Bjurlin, Colbert Ewing, MD for an evaluation of metastatic prostate cancer.    He visited his PCP on 05/10/17 and was found to have elevated PSA to 46.5. Referral to urology (Dr. Sherryl Barters) showed 1-2cm left lateral prostate nodule and repeat PSA was 68.9. Bone scan showed multiple sites of osseous metastatic disease including right  pelvis, ribs, spine, sternum, and left calvarium. CT AP showed no adenopathy. Last week he saw radiation oncology and urology and was recommended systemic treatment for his cancer.    Today he describes back pain, worse with activity, as well as urinary frequency. Recently started on Flomax. No other bone pain. He does have fatigue.    Past Medical History:  HTN  High cholesterol    Medications:    Current Outpatient Medications on File Prior to Visit   Medication Sig Dispense Refill   ??? ferrous sulfate (SLOW FE ORAL) Take by mouth.     ??? lisinopril-hydrochlorothiazide (PRINZIDE,ZESTORETIC) 10-12.5 mg per tablet Take 1 tablet by mouth daily.     ??? multivitamin capsule Take 1 capsule by mouth.     ??? niacin 500 MG tablet Take 1,000 mg by mouth.     ??? nystatin-triamcinolone (MYCOLOG II) cream Apply topically.     ??? omega-3 acid ethyl esters (LOVAZA) 1 gram capsule Take by mouth.     ??? pantoprazole (PROTONIX) 40 MG tablet Take by mouth.     ??? sucralfate (CARAFATE) 1 gram tablet Take 1 g by mouth.     ??? tamsulosin (FLOMAX) 0.4 mg capsule Take 1 capsule (0.4 mg total) by mouth daily. 90 capsule 3     No current facility-administered medications on file prior to visit.      Allergies:  PCN    Social History:  H/o light smoking.  Wife with him today, married x 1 year.  Son with autism, age 91, lives with them. Also has 92 year old daughter, living with ex wife.  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  Occasional alcohol use.    Family History:  Mother had breast cancer and rectal cancer, age 18 when she died. Also had hysterectomy. Father had skin cancer. Maternal grandfather had prostate cancer, died in early 74's. Maternal grandmother without cancer. Mother has sister, brother and 1/2 brother - no known cancers. Patient has 2 older sisters without cancer.    Review of Systems: A 10 system review was completed and was negative except per HPI    Physical Exam:  Vitals:    08/21/17 1116   BP: 136/67   Pulse: 65   Resp: 16   Temp: 36.3 ??C (97.4 ??F)   SpO2: 99%   ECOG PS 0    General:   No acute distress, alert and interactive   Eyes:   Pupils equally round and reactive to light.  Extra ocular muscles intact.  Sclera not icteric.   ENT:  Oropharynx clear.   Neck:  Supple, no thyromegaly.   Lymph Nodes:  No adenopathy (cervical, axillary, inguinal)   Cardiovascular:  RRR without murmurs, rubs, gallops   Lungs:  Clear to auscultation bilaterally, without wheezes/crackles/rhonchi.   Skin:    No rash, lesions or breakdown    Abdomen:   Abdomen soft, not tender and not distended, no hepatosplenomegaly or masses.   Extremities:   Warm and well-perfused without edema.   Neurological:  Alert and oriented to person, place and time.     Labs:    Reviewed in Epic    Imaging:    07/28/17 Bone scan:  1. Multiple foci of osseous activity involving the right pelvis,  bilateral ribs, spine, sternum and left calvarium consistent with  osseous metastatic disease. Small focus of activity either involving  the left anterior first rib or  medial left clavicle.  2. Probable degenerative activity at the [facility] joints and left ankle.    07/28/17 CT AP:  1. Scattered small sclerotic bone lesions and a large area of  sclerosis involving the right iliac crest. Findings suspicious for  osseous metastatic disease. Recommend correlation with pending bone scan.  2. No abdominal/pelvic lymphadenopathy or evidence of  intra-abdominal/pelvic metastatic disease.  3. Diffuse and marked fatty infiltration of the liver.  4. Cholelithiasis.    Pathology:    08/10/17:  A. Prostate, left base, needle core biopsy  - Prostatic adenocarcinoma, Gleason score 3+4=7 (20% grade 4), Grade group II, involving 1 of 1 cores, 9 mm in linear extent, 80% of total core length  B. Prostate, left mid, needle core biopsy  - Prostatic adenocarcinoma, Gleason score 3+4=7 (10% grade 4), Grade group II, involving 1 of 1 cores, 8 mm in linear extent, 70% of total core length  C. Prostate, left apex, needle core biopsy  - Prostatic adenocarcinoma, Gleason score 3+4=7 (30% grade 4), Grade group II, involving 1 of 1 cores, 10 mm in linear extent, 90% of total core length  D. Prostate, right base, needle core biopsy  - Benign prostatic glands and stroma, no carcinoma identified  E. Prostate, right mid, needle core biopsy  - Benign prostatic glands and stroma, no carcinoma identified  F. Prostate, right apex, needle core biopsy  - Benign prostatic glands and stroma, no carcinoma identified  G. Prostate, left lateral base, needle core biopsy  - Prostatic adenocarcinoma, Gleason score 3+4=7 (10% grade 4), Grade group II, involving 1 of 1 cores, 6 mm in linear extent, 40% of total core length  H. Prostate, left lateral mid, needle core biopsy  - Prostatic adenocarcinoma, Gleason score 3+4=7 (10% grade 4), Grade group II, involving 1 of 1 cores, 12 mm in linear extent, 80% of total core length  I. Prostate, left lateral apex, needle core biopsy  - Prostatic adenocarcinoma, Gleason score 4+3=7 (90% grade 4), Grade group III, involving 2 of 2 cores, 6 mm and <1 mm in linear extent, 70% of total core length??  J. Prostate, right lateral base, needle core biopsy  - Benign prostatic glands and stroma, no carcinoma identified  K. Prostate, right lateral mid, needle core biopsy  - Benign prostatic glands and stroma, no carcinoma identified  L. Prostate, right lateral apex, needle core biopsy  - Benign prostatic glands and stroma, no carcinoma identified

## 2017-08-21 NOTE — Unmapped (Signed)
Clinical Pharmacist Practitioner: GU Oncology Clinic    New Start Abiraterone Education    Curtis Harris is a 62 y.o. male with metastatic castrate sensitive prostate cancer who I am counseling today on initiation of oral chemotherapy.    Oral chemotherapy regimen: Abiraterone 1000 mg daily and prednisone 5 mg daily    Side effects discussed included but were not limited to: hypertension, edema, hypokalemia, fatigue, diarrhea, hot flashes, muscle and joint aches and LFT abnormalities. Side effect prevention and management were also reviewed including LFT monitoring every 2 weeks for 3 months and monthly thereafter.     Administration: I reviewed the importance of taking without food i.e. 1h before of 2h after a meal as well as the importance of medication adherence. Also discussed the use of steroids in combination with abiraterone to avoid side effects of adrenal insufficiency.    Drug Interactions: Instructed the patient that this medication has potential drug interactions. The patient should inform me of any new prescriptions so that I may evaluate for potential drug interactions.other medications reviewed and up to date in Epic.  No drug interactions identified.    Storage requirements: this medicine should be stored at room temperature.     Handling precautions reviewed    Comorbidities/Allergies: reviewed and up to date in Epic.    Handout provided: Chemotherapy handout from [facility]    Patient verbalized understanding of the above information as well as how to contact the Team with any questions/concerns.    Assessment/ Plan:  1. Medication Management- reviewed and updated medication list    2. Bone health- patient currently already taking tums 2 tabs BID, continue tums and add vitamin D. Will get DEXA for baseline evaluation.    3. Abiraterone monitoring and side effect management- they will monitor BP at home and report BP of >150/90    4. Abiraterone Access- will send to [facility] per patient preference and follow up when benefits investigation is done.     F/u: 1 month for Lupron    Time with patient: 20 min    Laverna Peace PharmD, BCOP, CPP  Hematology/Oncology Pharmacist  P: [phone]

## 2017-08-23 MED ORDER — PREDNISONE 5 MG TABLET
ORAL_TABLET | Freq: Every day | ORAL | 11 refills | 0.00000 days | Status: CP
Start: 2017-08-23 — End: 2017-09-22

## 2017-08-23 MED ORDER — PREDNISONE 5 MG TABLET: 5 mg | tablet | Freq: Every day | 11 refills | 0 days | Status: AC

## 2017-08-23 MED ORDER — ABIRATERONE 250 MG TABLET
Freq: Every day | ORAL | 6 refills | 0.00000 days | Status: CP
Start: 2017-08-23 — End: 2017-08-23

## 2017-08-23 MED ORDER — ABIRATERONE 250 MG TABLET: each | 6 refills | 0 days

## 2017-08-23 NOTE — Unmapped (Signed)
Per test claim for abiraterone at the [facility], patient needs Medication Assistance Program for Prior Authorization.

## 2017-08-25 NOTE — Unmapped (Signed)
[facility] Specialty Medication Referral: PA APPROVED    Medication (Brand/Generic): Abiraterone     Initial FSI Test Claim completed with resulted information below:  No PA required  Patient ABLE to fill at [facility]  Insurance Company:  [facility]  Anticipated Copay: $65.00  Is anticipated copay with a copay card or grant? No    As Co-pay is under $100 defined limit, per policy there will be no further investigation of need for financial assistance at this time unless patient requests. This referral has been communicated to the provider and handed off to the [facility] team for further processing and filling of prescribed medication.   ______________________________________________________________________  Please utilize this referral for viewing purposes as it will serve as the central location for all relevant documentation and updates.

## 2017-08-29 ENCOUNTER — Other Ambulatory Visit: Payer: Self-pay | Admitting: Urology

## 2017-08-29 MED FILL — ABIRATERONE ACETATE/250MG/TABS: ABIRATERONE ACETATE/250MG/TABS | 30 days supply | Qty: 120 | Fill #0

## 2017-08-29 MED FILL — PREDNISONE/5MG/TABS: PREDNISONE/5MG/TABS | 30 days supply | Qty: 30 | Fill #0

## 2017-08-29 NOTE — Unmapped (Signed)
[facility] Onboarding    Specialty Medication(s): abiraterone 1000 mg  Additional  Medication(s): prednisone 5 mg  Diagnosis:   Refills: 30 day    Charolette Child., DOB: February 23, 1956  Phone: [phone] (home)   Confirmed Shipping address: [address]  [place] [address]  All above HIPAA information was verified with patient.    Next Scheduled Delivery Date: 08/30/2017 from [facility]     Financial/Shipment   Primary Billing: BCBS  Anticipated copay of $65 reviewed with patient (See full details under Referrals tab in EPIC).    Verified delivery address in FSI and reviewed medication storage requirement.    The following was explained to the patient:  Advised patient of the following:  -A Welcome packet will be sent to the patient   -Assignment of Benefit for the patient to review and return before next refill  -Arrangement of payment method can be done by contacting the pharmacy  -Take medications with during travel, have doctor's appointments, or if being admitted to the hospital.    Advised patient of refill order process:  Specialty pharmacy process for medication shipment was also reviewed.  Discussed the service provided by [facility] with respects to monthly outbound calls to the patient to set up refill deliveries 7-10 days prior to their subsequent needed refill.  Emphasized need for patient to be reachable in order to schedule medication shipment and that shipment will be shipped to the deliverable address provided via UPS.  Informed patient that welcome packet will be sent.        Provided the [facility] pharmacy contact information below:  [facility] [phone], option 4)    Medication Education   Patient was counseled on this medication previously please see prior note.     Patient verbalized understanding of the above information.  Answered all patient questions.  [facility] team will follow up with patient monthly for standard refill processing and delivery.    Carole Binning  PharmD Candidate     Laverna Peace PharmD, BCOP, CPP  Hematology/Oncology Pharmacist  P: [phone]

## 2017-09-06 DIAGNOSIS — C61 Malignant neoplasm of prostate: Secondary | ICD-10-CM

## 2017-09-11 NOTE — Unmapped (Signed)
Spoke with pt - pt started taking abiraterone and prednisone on 5/1. Only complaints are mild fatigue and queasiness. Reviewed with pt to make sure he is taking abiraterone with an empty stomach and prednisone with a full stomach. Pt also verbalized that this queasiness was an issue prior to starting abiraterone and is being seen by GI and has endoscopy scheduled on 5/23 locally. Pt will call if fatigue gets worse. Will check labs when he is back to see Dr. Okey Dupre.

## 2017-09-12 ENCOUNTER — Other Ambulatory Visit: Payer: Self-pay

## 2017-09-13 ENCOUNTER — Encounter: Admit: 2017-09-13 | Discharge: 2017-09-13 | Payer: MEDICARE

## 2017-09-13 DIAGNOSIS — C61 Malignant neoplasm of prostate: Principal | ICD-10-CM

## 2017-09-13 DIAGNOSIS — C7951 Secondary malignant neoplasm of bone: Secondary | ICD-10-CM

## 2017-09-18 ENCOUNTER — Encounter: Admit: 2017-09-18 | Discharge: 2017-09-19 | Payer: MEDICARE | Attending: Internal Medicine | Primary: Internal Medicine

## 2017-09-18 ENCOUNTER — Encounter: Admit: 2017-09-18 | Discharge: 2017-09-19 | Payer: MEDICARE

## 2017-09-18 DIAGNOSIS — C7951 Secondary malignant neoplasm of bone: Secondary | ICD-10-CM

## 2017-09-18 DIAGNOSIS — C61 Malignant neoplasm of prostate: Principal | ICD-10-CM

## 2017-09-18 LAB — COMPREHENSIVE METABOLIC PANEL
ALBUMIN: 3.9 g/dL (ref 3.5–5.0)
ALKALINE PHOSPHATASE: 160 U/L — ABNORMAL HIGH (ref 38–126)
ALT (SGPT): 48 U/L (ref 19–72)
ANION GAP: 11 mmol/L (ref 9–15)
AST (SGOT): 33 U/L (ref 19–55)
BILIRUBIN TOTAL: 0.3 mg/dL (ref 0.0–1.2)
BUN / CREAT RATIO: 22
CALCIUM: 9.2 mg/dL (ref 8.5–10.2)
CHLORIDE: 103 mmol/L (ref 98–107)
CO2: 28 mmol/L (ref 22.0–30.0)
CREATININE: 0.79 mg/dL (ref 0.70–1.30)
EGFR MDRD AF AMER: >=60 mL/min/{1.73_m2} (ref >=60)
EGFR MDRD NON AF AMER: >=60 mL/min/{1.73_m2} (ref >=60)
GLUCOSE RANDOM: 96 mg/dL (ref 65–179)
POTASSIUM: 4.2 mmol/L (ref 3.5–5.0)
PROTEIN TOTAL: 6.5 g/dL (ref 6.5–8.3)
SODIUM: 142 mmol/L (ref 135–145)

## 2017-09-18 LAB — CBC W/ AUTO DIFF
BASOPHILS ABSOLUTE COUNT: 0.1 10*9/L (ref 0.0–0.1)
BASOPHILS RELATIVE PERCENT: 0.8 %
EOSINOPHILS ABSOLUTE COUNT: 0.2 10*9/L (ref 0.0–0.4)
EOSINOPHILS RELATIVE PERCENT: 3.7 %
HEMATOCRIT: 39.2 % — ABNORMAL LOW (ref 41.0–53.0)
HEMOGLOBIN: 13.2 g/dL — ABNORMAL LOW (ref 13.5–17.5)
LARGE UNSTAINED CELLS: 3 % (ref 0–4)
LYMPHOCYTES RELATIVE PERCENT: 31.6 %
MEAN CORPUSCULAR HEMOGLOBIN CONC: 33.7 g/dL (ref 31.0–37.0)
MEAN CORPUSCULAR HEMOGLOBIN: 30.1 pg (ref 26.0–34.0)
MEAN PLATELET VOLUME: 7.2 fL (ref 7.0–10.0)
MONOCYTES ABSOLUTE COUNT: 0.4 10*9/L (ref 0.2–0.8)
MONOCYTES RELATIVE PERCENT: 7.7 %
NEUTROPHILS ABSOLUTE COUNT: 2.9 10*9/L (ref 2.0–7.5)
NEUTROPHILS RELATIVE PERCENT: 53.6 %
PLATELET COUNT: 176 10*9/L (ref 150–440)
RED BLOOD CELL COUNT: 4.4 10*12/L — ABNORMAL LOW (ref 4.50–5.90)
RED CELL DISTRIBUTION WIDTH: 13.8 % (ref 12.0–15.0)
WBC ADJUSTED: 5.4 10*9/L (ref 4.5–11.0)

## 2017-09-18 LAB — HEMATOCRIT: Lab: 39.2 — ABNORMAL LOW

## 2017-09-18 LAB — BUN / CREAT RATIO: Urea nitrogen/Creatinine:MRto:Pt:Ser/Plas:Qn:: 22

## 2017-09-18 LAB — PROSTATE SPECIFIC ANTIGEN: Prostate specific Ag:MCnc:Pt:Ser/Plas:Qn:: 18 — ABNORMAL HIGH

## 2017-09-18 LAB — PHOSPHORUS: Phosphate:MCnc:Pt:Ser/Plas:Qn:: 3.7

## 2017-09-18 MED ORDER — SILDENAFIL 50 MG TABLET
ORAL_TABLET | Freq: Every day | ORAL | 0 refills | 0.00000 days | Status: CP | PRN
Start: 2017-09-18 — End: 2017-12-21

## 2017-09-18 NOTE — Unmapped (Signed)
1610:  Labs drawn and sent for analysis.  Care provided by  Will Bonnet.

## 2017-09-18 NOTE — Unmapped (Signed)
Genitourinary Oncology Clinic Follow-Up Note    Patient Name: Curtis Harris.  Encounter Date: 09/18/2017    Referring Physician: Lynnea Ferrier, MD  [address] [facility] [place], [place] [address]  Urologist: Angela Adam  Rad onc: Imogene Burn    Assessment/Plan:    62 y.o. with HTN who presents in follow-up of metastatic prostate cancer to bone.     1. Hormone sensitive metastatic prostate cancer: He has high volume osseous metastatic disease. He has initiated ADT with degarelix 1 month ago and will change to q56mo Lupron today.    I have reviewed the rationale for ADT and side effects including but not limited to asthenia, fatigue, mood changes, bone loss, muscle atrophy, male menopause symptoms including hot flashes, loss of libido, gynecomastia, cardiovascular/metabolic syndrome, osteoporosis, and reduced muscle mass, as well as others. I have counseled the patient on the need to take calcium and vitamin D as well as regular weight bearing exercise to minimize the side effects of ADT.  It is expected that virtually all patients will develop progressive disease while on ADT and at that time additional treatment would be required.     He has also initiated abiraterone per the STAMPEDE and [facility] studies. Side effects of abi include cardiac disorders, hyperaldosteronism (hypokalemia, peripheral edema, HTN, CHF), and LFT abnormalities. I discussed need to take without food (take at least 2 hours after and 1 hour before meals). Will plan to check LFTS every 2 weeks for 3 months and monthly thereafter, as well as BMP at least monthly. Advised need to check BP daily at home and will check in clinic at least monthly.    2. ED: New onset since initiation of ADT . Will try sildenafil. If not effective, he will come back to meet with Corrie Dandy to discuss other options for treatment.    Plan  - Change to Lupron, given dose today 09/18/17  - Continue abiraterone and prednisone  - Calcium and vitamin D  - LFTs 2 weeks, RTC 4 weeks    History of Present Illness:    Curtis Harris. is a 62 y.o. male who is seen in follow-up of metastatic prostate cancer.    He visited his PCP on 05/10/17 and was found to have elevated PSA to 46.5. Referral to urology (Dr. Sherryl Barters) showed 1-2cm left lateral prostate nodule and repeat PSA was 68.9. Bone scan showed multiple sites of osseous metastatic disease including right pelvis, ribs, spine, sternum, and left calvarium. CT AP showed no adenopathy.    He started abi/pred around 08/31/17. He checks BPs at home occasionally and have been in good range. He has subjective mild peripheral edema but not visually noticeable. Back pain is mild. Fatigue present. No other bone pain.    Past Medical History:  HTN  High cholesterol   RBBB    Medications:    Current Outpatient Medications on File Prior to Visit   Medication Sig Dispense Refill   ??? abiraterone (ZYTIGA) 250 mg Tab tablet Take 4 tablets (1,000 mg total) by mouth daily. 120 tablet 6   ??? calcium carbonate (TUMS) 200 mg calcium (500 mg) chewable tablet Chew 2 tablets Two (2) times a day.     ??? ferrous sulfate (SLOW FE ORAL) Take 1 tablet by mouth daily.      ??? ibuprofen (ADVIL,MOTRIN) 200 MG tablet Take 200 mg by mouth every twelve (12) hours as needed for pain.     ??? krill-om-3-dha-epa-phospho-ast (MAXIMUM  RED KRILL OMEGA-3) 300-90-27-45 mg cap Take 1 capsule by mouth.     ??? lisinopril-hydrochlorothiazide (PRINZIDE,ZESTORETIC) 10-12.5 mg per tablet Take 1 tablet by mouth daily.     ??? multivitamin capsule Take 1 capsule by mouth.     ??? pantoprazole (PROTONIX) 40 MG tablet Take 40 mg by mouth daily at 0600.      ??? predniSONE (DELTASONE) 5 MG tablet Take 1 tablet (5 mg total) by mouth daily. 30 tablet 11   ??? sucralfate (CARAFATE) 1 gram tablet Take 1 g by mouth Four (4) times a day.      ??? tamsulosin (FLOMAX) 0.4 mg capsule Take 1 capsule (0.4 mg total) by mouth daily. 90 capsule 3     No current facility-administered medications on file prior to visit.      Allergies:  PCN    Social History:  H/o light smoking.  Wife with him today, married x 1 year.  Son with autism, age 23, lives with them. Also has 71 year old daughter, living with ex wife.  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  Occasional alcohol use.    Family History:  Mother had breast cancer and rectal cancer, age 34 when she died. Also had hysterectomy. Father had skin cancer. Maternal grandfather had prostate cancer, died in early 82's. Maternal grandmother without cancer. Mother has sister, brother and 1/2 brother - no known cancers. Patient has 2 older sisters without cancer.    Review of Systems: A 10 system review was completed and was negative except per HPI    Physical Exam:  Vitals:    09/18/17 1044   BP: 144/73   Pulse: 61   Resp: 16   Temp: 36.3 ??C (97.4 ??F)   SpO2: 99%     ECOG PS 0    General:   No acute distress, alert and interactive   Eyes:   Pupils equally round.  Extra ocular muscles intact.  Sclera not icteric.   ENT:  Oropharynx clear.   Neck:  Supple, no thyromegaly.   Lymph Nodes:  No adenopathy (cervical, axillary, inguinal)   Cardiovascular:  RRR without murmurs, rubs, gallops   Lungs:  Clear to auscultation bilaterally, without wheezes/crackles/rhonchi.   Skin:    No rash, lesions or breakdown    Abdomen:   Abdomen soft, not tender and not distended, no hepatosplenomegaly or masses.   Extremities:   Warm and well-perfused without edema.   Neurological:  Alert and oriented to person, place and time.     Labs:    Reviewed in Epic    Strata 08/30/17:  No alterations identified  MS-stable, PDL1 low    Imaging:    07/28/17 Bone scan:  1. Multiple foci of osseous activity involving the right pelvis,  bilateral ribs, spine, sternum and left calvarium consistent with  osseous metastatic disease. Small focus of activity either involving  the left anterior first rib or medial left clavicle.  2. Probable degenerative activity at the [facility] joints and left ankle.    07/28/17 CT AP:  1. Scattered small sclerotic bone lesions and a large area of  sclerosis involving the right iliac crest. Findings suspicious for  osseous metastatic disease. Recommend correlation with pending bone scan.  2. No abdominal/pelvic lymphadenopathy or evidence of  intra-abdominal/pelvic metastatic disease.  3. Diffuse and marked fatty infiltration of the liver.  4. Cholelithiasis.    09/13/17 CT Chest:  Multiple  scattered sclerotic bone lesions consistent with osseous metastatic disease, better evaluated on most recent nuclear medicine bone scan.  No discrete pulmonary nodules.   Hepatic steatosis.    09/13/17 DEXA:  Lumbar spine: Normal bone density  Left proximal femur: Normal bone density    Pathology:    08/10/17:  A. Prostate, left base, needle core biopsy  - Prostatic adenocarcinoma, Gleason score 3+4=7 (20% grade 4), Grade group II, involving 1 of 1 cores, 9 mm in linear extent, 80% of total core length  B. Prostate, left mid, needle core biopsy  - Prostatic adenocarcinoma, Gleason score 3+4=7 (10% grade 4), Grade group II, involving 1 of 1 cores, 8 mm in linear extent, 70% of total core length  C. Prostate, left apex, needle core biopsy  - Prostatic adenocarcinoma, Gleason score 3+4=7 (30% grade 4), Grade group II, involving 1 of 1 cores, 10 mm in linear extent, 90% of total core length  D. Prostate, right base, needle core biopsy  - Benign prostatic glands and stroma, no carcinoma identified  E. Prostate, right mid, needle core biopsy  - Benign prostatic glands and stroma, no carcinoma identified  F. Prostate, right apex, needle core biopsy  - Benign prostatic glands and stroma, no carcinoma identified  G. Prostate, left lateral base, needle core biopsy  - Prostatic adenocarcinoma, Gleason score 3+4=7 (10% grade 4), Grade group II, involving 1 of 1 cores, 6 mm in linear extent, 40% of total core length  H. Prostate, left lateral mid, needle core biopsy  - Prostatic adenocarcinoma, Gleason score 3+4=7 (10% grade 4), Grade group II, involving 1 of 1 cores, 12 mm in linear extent, 80% of total core length  I. Prostate, left lateral apex, needle core biopsy  - Prostatic adenocarcinoma, Gleason score 4+3=7 (90% grade 4), Grade group III, involving 2 of 2 cores, 6 mm and <1 mm in linear extent, 70% of total core length??  J. Prostate, right lateral base, needle core biopsy  - Benign prostatic glands and stroma, no carcinoma identified  K. Prostate, right lateral mid, needle core biopsy  - Benign prostatic glands and stroma, no carcinoma identified  L. Prostate, right lateral apex, needle core biopsy  - Benign prostatic glands and stroma, no carcinoma identified

## 2017-09-18 NOTE — Unmapped (Addendum)
It was a pleasure to see you in clinic today.    We will switch you to Lupron  Labs in 2 weeks  Send prescription for Viagra/sildenafil to [facility] information:    Nurse Navigator: Laury Deep  Phone: [phone]  For scheduling needs or questions Monday through Friday 8AM-5PM, please contact [phone] or Toll free [phone]  For urgent needs on nights and weekends, call [phone] and ask for the oncology fellow on call.    Please visit PrivacyFever.cz, a resource created just for family members and caregivers. This website lists support services, how and where to ask for help. It has tools to assist you as you help [place] care for your loved one.    [facility]  [address]  [place], [place] [address]  [email or web address]         sildenafil (oral)  Pronunciation:  sil DEN a fil  Brand:  Revatio, Viagra  What is the most important information I should know about oral sildenafil?  Some medicines can cause unwanted or dangerous effects when used with sildenafil. Tell your doctor about all your current medicines, especially riociguat (Adempas).  Do not take sildenafil if you are also using a nitrate drug for chest pain or heart problems, including nitroglycerin, isosorbide dinitrate, isosorbide mononitrate, and some recreational drugs such as poppers. Taking sildenafil with a nitrate medicine can cause a sudden and serious decrease in blood pressure.  Contact your doctor or seek emergency medical attention if your erection is painful or lasts longer than 4 hours. A prolonged erection (priapism) can damage the penis.  Stop using sildenafil and get emergency medical help if you have sudden vision loss.  What is sildenafil?  Sildenafil relaxes muscles of the blood vessels and increases blood flow to particular areas of the body.  Sildenafil under the name Viagra is used to treat erectile dysfunction (impotence) in men. Another brand of sildenafil is Revatio, which is used to treat pulmonary arterial hypertension and improve exercise capacity in men and women.  Do not take Viagra while also taking Revatio, unless your doctor tells you to.  Sildenafil may also be used for purposes not listed in this medication guide.  What should I discuss with my healthcare provider before taking oral sildenafil?  You should not use sildenafil if you are allergic to it, or:  ?? if you take other medicines to treat pulmonary arterial hypertension, such as riociguat (Adempas).  Do not take sildenafil if you are also using a nitrate drug for chest pain or heart problems. This includes nitroglycerin, isosorbide dinitrate, and isosorbide mononitrate. Nitrates are also found in some recreational drugs such as amyl nitrate or nitrite (poppers). Taking sildenafil with a nitrate medicine can cause a sudden and serious decrease in blood pressure.  To make sure sildenafil is safe for you, tell your doctor if you have ever had:  ?? heart disease or heart rhythm problems, coronary artery disease;  ?? heart attack, stroke, or congestive heart failure;  ?? high or low blood pressure;  ?? liver or kidney disease;  ?? a blood cell disorder such as sickle cell anemia, multiple myeloma, or leukemia;  ?? a bleeding disorder such as hemophilia;  ?? a stomach ulcer;  ?? retinitis pigmentosa (an inherited condition of the eye);  ?? a physical deformity of the penis (such as Peyronie's disease); or  ?? if you have been told you should not have sexual intercourse for health reasons.  Sildenafil can  decrease blood flow to the optic nerve of the eye, causing sudden vision loss. This has occurred in a small number of people taking sildenafil, most of whom also had heart disease, diabetes, high blood pressure, high cholesterol, or certain pre-existing eye problems, and in those who smoked or were over 49 years old. It is not clear whether sildenafil is the actual cause of vision loss.  This medicine is not expected to harm an unborn baby. Tell your doctor if you are pregnant or plan to become pregnant.  It is not known whether sildenafil passes into breast milk or if it could harm a nursing baby. Tell your doctor if you are breast-feeding a baby.  Do not give this medication to anyone under 32 years old without medical advice.  How should I take oral sildenafil?  Follow all directions on your prescription label. Do not take this medicine in larger or smaller amounts or for longer than recommended.  Revatio  is usually taken three times each day, about 4 to 6 hours apart.  Viagra  is usually taken only when needed, 30 minutes to 1 hour before sexual activity. You may take it up to 4 hours before sexual activity. Do not take Viagra more than once per day.  Shake the oral suspension (liquid) well just before you measure a dose. Measure liquid medicine with the dosing syringe provided, or with a special dose-measuring spoon or medicine cup. If you do not have a dose-measuring device, ask your pharmacist for one.  Viagra  can help you have an erection when sexual stimulation occurs. An erection will not occur just by taking a pill. Follow your doctor's instructions.  During sexual activity, if you become dizzy or nauseated, or have pain, numbness, or tingling in your chest, arms, neck, or jaw, stop and call your doctor right away. You could be having a serious side effect of sildenafil.  Store at room temperature away from moisture and heat.  What happens if I miss a dose?  Since Viagra  is used as needed, you are not likely to miss a dose.  If you miss a dose of Revatio, take the missed dose as soon as you remember. Skip the missed dose if it is almost time for your next scheduled dose. Do not  take extra medicine to make up the missed dose.  What happens if I overdose?  Seek emergency medical attention or call the Poison Help line at [phone].  What should I avoid while taking oral sildenafil?  Drinking alcohol with this medicine can cause side effects.  Grapefruit and grapefruit juice may interact with sildenafil and lead to unwanted side effects. Avoid the use of grapefruit products while taking sildenafil.  Avoid using any other medicines to treat impotence, such as alprostadil or yohimbine, without first talking to your doctor.  What are the possible side effects of oral sildenafil?  Get emergency medical help if you have signs of an allergic reaction: hives; difficulty breathing; swelling of your face, lips, tongue, or throat.  Stop taking sildenafil and get emergency medical help if you have:  ?? heart attack symptoms --chest pain or pressure, pain spreading to your jaw or shoulder, nausea, sweating;  ?? vision changes or sudden vision loss; or  ?? erection is painful or lasts longer than 4 hours (prolonged erection can damage the penis).  Call your doctor at once if you have:  ?? ringing in your ears, or sudden hearing loss;  ?? irregular heartbeat;  ??  swelling in your hands, ankles, or feet;  ?? shortness of breath;  ?? seizure (convulsions); or  ?? a light-headed feeling, like you might pass out.  Common side effects may include:  ?? flushing (warmth, redness, or tingly feeling);  ?? headache, dizziness;  ?? abnormal vision (blurred vision, changes in color vision)  ?? runny or stuffy nose, nosebleeds;  ?? sleep problems (insomnia);  ?? muscle pain, back pain; or  ?? upset stomach.  This is not a complete list of side effects and others may occur. Call your doctor for medical advice about side effects. You may report side effects to FDA at 1-800-FDA-1088.  What other drugs will affect oral sildenafil?  Do not take sildenafil with similar medications such as avanafil Jerral Ralph), tadalafil (Cialis) or vardenafil (Levitra). Tell your doctor about all other medications you use for erectile dysfunction.  Tell your doctor about all your current medicines and any you start or stop using, especially:  ?? drugs to treat high blood pressure or a prostate disorder;  ?? an antibiotic --clarithromycin, erythromycin, or telithromycin;  ?? antifungal medicine --ketoconazole or itraconazole; or  ?? medicine to treat HIV/AIDS --atazanavir, indinavir, ritonavir, or saquinavir.  This list is not complete. Other drugs may interact with sildenafil, including prescription and over-the-counter medicines, vitamins, and herbal products. Not all possible interactions are listed in this medication guide.  Where can I get more information?  Your pharmacist can provide more information about sildenafil.  Remember, keep this and all other medicines out of the reach of children, never share your medicines with others, and use this medication only for the indication prescribed.  Every effort has been made to ensure that the information provided by [employer] ('[employer]') is accurate, up-to-date, and complete, but no guarantee is made to that effect. Drug information contained herein may be time sensitive. [employer] information has been compiled for use by healthcare practitioners and consumers in the [place] and therefore [employer] does not warrant that uses outside of the [place] are appropriate, unless specifically indicated otherwise. [employer]'s drug information does not endorse drugs, diagnose patients or recommend therapy. [employer]'s drug information is an [employer], corporate to assist licensed healthcare practitioners in caring for their patients and/or to serve consumers viewing this service as a supplement to, and not a substitute for, the expertise, skill, knowledge and judgment of healthcare practitioners. The absence of a warning for a given drug or drug combination in no way should be construed to indicate that the drug or drug combination is safe, effective or appropriate for any given patient. [employer] does not assume any responsibility for any aspect of healthcare administered with the aid of information [employer] provides. The information contained herein is not intended to cover all possible uses, directions, precautions, warnings, drug interactions, allergic reactions, or adverse effects. If you have questions about the drugs you are taking, check with your doctor, nurse or pharmacist.  Copyright [phone])[phone] [employer] Version: 12.01. Revision date: 02/11/2016.  Care instructions adapted under license by [facility]. If you have questions about a medical condition or this instruction, always ask your healthcare professional. [employer] disclaims any warranty or liability for your use of this information.

## 2017-09-20 ENCOUNTER — Encounter: Payer: Self-pay | Admitting: *Deleted

## 2017-09-20 NOTE — Unmapped (Signed)
[facility] Refill Coordination Note  Specialty Medication(s): Abiraterone 250 mg 4 tabs daily & Prednisone 5 mg 1 tab daily  Additional Medications shipped: None    Curtis Harris., DOB: 15-Jul-1955  Phone: [phone] (home) , Alternate phone contact: N/A  Phone or address changes today?: No  All above HIPAA information was verified with patient.  Shipping Address: [address]  [place] [address]   Insurance changes? No    Completed refill call assessment today to schedule patient's medication shipment from the [facility] [phone]).      Confirmed the medication and dosage are correct and have not changed: Yes, regimen is correct and unchanged.    Confirmed patient started or stopped the following medications in the past month:  No, there are no changes reported at this time.    Are you tolerating your medication?:  Curtis Harris reports tolerating the medication.    ADHERENCE    Did you miss any doses in the past 4 weeks? No missed doses reported.    FINANCIAL/SHIPPING    Delivery Scheduled: Yes, Expected medication delivery date: 09/22/17 COURIER    The patient will receive an FSI print out for each medication shipped and additional FDA Medication Guides as required.  Patient education from [employer] or Robet Leu may also be included in the shipment    Curtis Harris did not have any additional questions at this time.    Delivery address validated in FSI scheduling system: Yes, address listed in FSI is correct.    We will follow up with patient monthly for standard refill processing and delivery.      Thank you,  Burnett Corrente   [facility] Specialty PharmD Candidate

## 2017-09-21 ENCOUNTER — Encounter: Payer: Self-pay | Admitting: *Deleted

## 2017-09-21 ENCOUNTER — Other Ambulatory Visit: Payer: Self-pay

## 2017-09-21 ENCOUNTER — Ambulatory Visit
Admission: RE | Admit: 2017-09-21 | Discharge: 2017-09-21 | Disposition: A | Discharge status: Home / Self Care | Payer: Federal, State, Local not specified - PPO | Source: Ambulatory Visit | Attending: Gastroenterology | Admitting: Gastroenterology

## 2017-09-21 ENCOUNTER — Ambulatory Visit: Payer: Federal, State, Local not specified - PPO | Admitting: Anesthesiology

## 2017-09-21 ENCOUNTER — Encounter
Admission: RE | Disposition: A | Discharge status: Home / Self Care | Payer: Self-pay | Source: Ambulatory Visit | Attending: Gastroenterology

## 2017-09-21 DIAGNOSIS — Z8371 Family history of colonic polyps: Secondary | ICD-10-CM | POA: Insufficient documentation

## 2017-09-21 DIAGNOSIS — K317 Polyp of stomach and duodenum: Secondary | ICD-10-CM | POA: Diagnosis not present

## 2017-09-21 DIAGNOSIS — K2950 Unspecified chronic gastritis without bleeding: Secondary | ICD-10-CM | POA: Insufficient documentation

## 2017-09-21 DIAGNOSIS — R1013 Epigastric pain: Secondary | ICD-10-CM | POA: Insufficient documentation

## 2017-09-21 DIAGNOSIS — K5730 Diverticulosis of large intestine without perforation or abscess without bleeding: Secondary | ICD-10-CM | POA: Diagnosis not present

## 2017-09-21 DIAGNOSIS — Z7952 Long term (current) use of systemic steroids: Secondary | ICD-10-CM | POA: Insufficient documentation

## 2017-09-21 DIAGNOSIS — Z1211 Encounter for screening for malignant neoplasm of colon: Secondary | ICD-10-CM | POA: Insufficient documentation

## 2017-09-21 DIAGNOSIS — K621 Rectal polyp: Secondary | ICD-10-CM | POA: Diagnosis not present

## 2017-09-21 DIAGNOSIS — Z79899 Other long term (current) drug therapy: Secondary | ICD-10-CM | POA: Diagnosis not present

## 2017-09-21 DIAGNOSIS — E785 Hyperlipidemia, unspecified: Secondary | ICD-10-CM | POA: Diagnosis not present

## 2017-09-21 DIAGNOSIS — Z87891 Personal history of nicotine dependence: Secondary | ICD-10-CM | POA: Diagnosis not present

## 2017-09-21 DIAGNOSIS — D120 Benign neoplasm of cecum: Secondary | ICD-10-CM | POA: Diagnosis not present

## 2017-09-21 DIAGNOSIS — K2980 Duodenitis without bleeding: Secondary | ICD-10-CM | POA: Diagnosis not present

## 2017-09-21 DIAGNOSIS — I10 Essential (primary) hypertension: Secondary | ICD-10-CM | POA: Diagnosis not present

## 2017-09-21 DIAGNOSIS — K210 Gastro-esophageal reflux disease with esophagitis: Secondary | ICD-10-CM | POA: Diagnosis not present

## 2017-09-21 DIAGNOSIS — Z800 Family history of malignant neoplasm of digestive organs: Secondary | ICD-10-CM | POA: Insufficient documentation

## 2017-09-21 DIAGNOSIS — G4730 Sleep apnea, unspecified: Secondary | ICD-10-CM | POA: Insufficient documentation

## 2017-09-21 DIAGNOSIS — Z8546 Personal history of malignant neoplasm of prostate: Secondary | ICD-10-CM | POA: Insufficient documentation

## 2017-09-21 HISTORY — PX: COLONOSCOPY WITH PROPOFOL: SHX5780

## 2017-09-21 HISTORY — DX: Cardiac murmur, unspecified: R01.1

## 2017-09-21 HISTORY — DX: Unspecified asthma, uncomplicated: J45.909

## 2017-09-21 HISTORY — DX: Depression, unspecified: F32.A

## 2017-09-21 HISTORY — PX: ESOPHAGOGASTRODUODENOSCOPY (EGD) WITH PROPOFOL: SHX5813

## 2017-09-21 HISTORY — DX: Major depressive disorder, single episode, unspecified: F32.9

## 2017-09-21 HISTORY — DX: Unspecified osteoarthritis, unspecified site: M19.90

## 2017-09-21 HISTORY — DX: Malignant (primary) neoplasm, unspecified: C80.1

## 2017-09-21 HISTORY — DX: Sleep apnea, unspecified: G47.30

## 2017-09-21 SURGERY — ESOPHAGOGASTRODUODENOSCOPY (EGD) WITH PROPOFOL
Anesthesia: General

## 2017-09-21 MED ORDER — PROPOFOL 10 MG/ML IV BOLUS
INTRAVENOUS | Status: DC | PRN
  Administered 2017-09-21: 100 mg via INTRAVENOUS

## 2017-09-21 MED ORDER — SODIUM CHLORIDE 0.9 % IV SOLN
INTRAVENOUS | Status: DC
  Administered 2017-09-21: 09:00:00 via INTRAVENOUS

## 2017-09-21 MED ORDER — LIDOCAINE HCL (PF) 2 % IJ SOLN
INTRAMUSCULAR | Status: AC
  Filled 2017-09-21: qty 10

## 2017-09-21 MED ORDER — FENTANYL CITRATE (PF) 100 MCG/2ML IJ SOLN
INTRAMUSCULAR | Status: DC | PRN
  Administered 2017-09-21 (×2): 50 ug via INTRAVENOUS

## 2017-09-21 MED ORDER — PHENYLEPHRINE HCL 10 MG/ML IJ SOLN
INTRAMUSCULAR | Status: DC | PRN
  Administered 2017-09-21: 100 ug via INTRAVENOUS

## 2017-09-21 MED ORDER — PROPOFOL 500 MG/50ML IV EMUL
INTRAVENOUS | Status: AC
  Filled 2017-09-21: qty 50

## 2017-09-21 MED ORDER — PROPOFOL 500 MG/50ML IV EMUL
INTRAVENOUS | Status: DC | PRN
  Administered 2017-09-21: 140 ug/kg/min via INTRAVENOUS

## 2017-09-21 MED ORDER — FENTANYL CITRATE (PF) 100 MCG/2ML IJ SOLN
INTRAMUSCULAR | Status: AC
Start: 2017-09-21 — End: ?
  Filled 2017-09-21: qty 2

## 2017-09-21 MED ORDER — LIDOCAINE 2% (20 MG/ML) 5 ML SYRINGE
INTRAMUSCULAR | Status: DC | PRN
  Administered 2017-09-21: 30 mg via INTRAVENOUS

## 2017-09-21 NOTE — Op Note (Signed)
[facility] Gastroenterology Patient Name: Brendan Larson Procedure Date: 09/21/2017 9:43 AM MRN: [id number] Account #: [id number] Date of Birth: 05/29/1955 Admit Type: Outpatient Age: 62 Room: [facility] ENDO ROOM 2 Gender: Male Note Status: Finalized Procedure:            Upper GI endoscopy Indications:          Dyspepsia, Gastro-esophageal reflux disease Providers:            Lollie Sails, MD Referring MD:         Irven Easterly. Kary Kos, MD (Referring MD) Medicines:            Monitored Anesthesia Care Complications:        No immediate complications. Procedure:            Pre-Anesthesia Assessment:                       - ASA Grade Assessment: III - A patient with severe                        systemic disease.                       After obtaining informed consent, the endoscope was                        passed under direct vision. Throughout the procedure,                        the patient's blood pressure, pulse, and oxygen                        saturations were monitored continuously. The Endoscope                        was introduced through the mouth, and advanced to the                        third part of duodenum. The upper GI endoscopy was                        accomplished without difficulty. The patient tolerated                        the procedure well. Findings:      LA Grade B (one or more mucosal breaks greater than 5 mm, not extending       between the tops of two mucosal folds) esophagitis with no bleeding was       found. Biopsies were taken with a cold forceps for histology.      Patchy minimal inflammation characterized by congestion (edema),       erythema and granularity was found in the gastric body. Biopsies were       taken with a cold forceps for histology. Biopsies were taken with a cold       forceps for Helicobacter pylori testing.      A single 5 mm sessile polyp with mild erythema and no stigmata of recent       bleeding was  found on the lesser curvature of the gastric body. Biopsies       were taken with a cold forceps for histology.  The cardia and gastric fundus were normal on retroflexion otherwise.      Patchy mild inflammation characterized by congestion (edema), erythema       and friability was found in the duodenal bulb and in the second portion       of the duodenum. Impression:           - LA Grade B erosive esophagitis. Biopsied.                       - Gastritis. Biopsied.                       - A single gastric polyp. Biopsied.                       - Duodenitis. Recommendation:       - Discharge patient to home.                       - Use Prilosec (omeprazole) 20 mg PO daily daily. Procedure Code(s):    --- Professional ---                       [phone], Esophagogastroduodenoscopy, flexible, transoral;                        with biopsy, single or multiple Diagnosis Code(s):    --- Professional ---                       K20.8, Other esophagitis                       K29.70, Gastritis, unspecified, without bleeding                       K31.7, Polyp of stomach and duodenum                       K29.80, Duodenitis without bleeding                       R10.13, Epigastric pain                       K21.9, Gastro-esophageal reflux disease without                        esophagitis CPT copyright 2017 American Medical Association. All rights reserved. The codes documented in this report are preliminary and upon coder review may  be revised to meet current compliance requirements. Lollie Sails, MD 09/21/2017 10:03:19 AM This report has been signed electronically. Number of Addenda: 0 Note Initiated On: 09/21/2017 9:43 AM      [facility]

## 2017-09-21 NOTE — H&P (Signed)
Outpatient short stay form Pre-procedure 09/21/2017 9:32 AM Brendan Sails MD  Primary Physician: Dr. Maryland Pink  Reason for visit: EGD and colonoscopy  History of present illness: Patient is a 62 year old male resenting today as above.  There is a family history of colon polyps and family history of rectal cancer in mother.  Patient's last colonoscopy was in 2007.  He did have some symptoms of reflux and dyspepsia and has had been on a proton pump inhibitor for about a month that improved this.  He currently is not taking that medication.  He tolerated his prep well.  Takes currently no aspirin or blood thinning agent.  He stopped any aspirin or NSAID product about a week ago.    Current Facility-Administered Medications:  .  0.9 %  sodium chloride infusion, , Intravenous, Continuous, Brendan Sails, MD, Last Rate: 20 mL/hr at 09/21/17 1610  Medications Prior to Admission  Medication Sig Dispense Refill Last Dose  . Multiple Vitamin (MULTIVITAMIN) capsule Take by mouth.   Past Week at Unknown time  . nystatin-triamcinolone (MYCOLOG II) cream Apply topically.   Past Week at Unknown time  . omega-3 acid ethyl esters (LOVAZA) 1 g capsule Take by mouth 2 (two) times daily.   Past Week at Unknown time  . omeprazole (PRILOSEC) 20 MG capsule Take by mouth.   Past Week at Unknown time  . pantoprazole (PROTONIX) 40 MG tablet Take by mouth.   Past Week at Unknown time  . predniSONE (DELTASONE) 5 MG tablet Take 5 mg by mouth daily with breakfast.     . rosuvastatin (CRESTOR) 10 MG tablet Take by mouth.   Past Week at Unknown time  . lisinopril-hydrochlorothiazide (PRINZIDE,ZESTORETIC) 10-12.5 MG tablet Take by mouth.   Taking     Allergies  Allergen Reactions  . Penicillins Rash  . Statins Other (See Comments)    Muscle Pain     Past Medical History:  Diagnosis Date  . Arthritis   . Asthma   . Cancer (HCC)    PROSTATE WITH METS TO BONE  . Chronic left shoulder pain 07/28/2017   . Depression   . Heart murmur    Mitral Valve Prolapse  . Hip pain, chronic, right 07/28/2017  . Hyperlipidemia   . Hypertension   . Sleep apnea     Review of systems:      Physical Exam    Heart and lungs: Regular rate and rhythm without rub or gallop, lungs are bilaterally clear.    HEENT: Normocephalic atraumatic eyes are anicteric    Other:    Pertinant exam for procedure: Soft nontender nondistended bowel sounds positive normoactive    Planned proceedures: EGD and colonoscopy with indicated procedures. I have discussed the risks benefits and complications of procedures to include not limited to bleeding, infection, perforation and the risk of sedation and the patient wishes to proceed.    Brendan Sails, MD Gastroenterology 09/21/2017  9:32 AM

## 2017-09-21 NOTE — Anesthesia Post-op Follow-up Note (Signed)
Anesthesia QCDR form completed.        

## 2017-09-21 NOTE — Anesthesia Postprocedure Evaluation (Signed)
Anesthesia Post Note  Patient: Taquan Bralley  Procedure(s) Performed: ESOPHAGOGASTRODUODENOSCOPY (EGD) WITH PROPOFOL (N/A ) COLONOSCOPY WITH PROPOFOL (N/A )  Patient location during evaluation: Endoscopy Anesthesia Type: General Level of consciousness: awake and alert Pain management: pain level controlled Vital Signs Assessment: post-procedure vital signs reviewed and stable Respiratory status: spontaneous breathing, nonlabored ventilation and respiratory function stable Cardiovascular status: blood pressure returned to baseline and stable Postop Assessment: no apparent nausea or vomiting Anesthetic complications: no     Last Vitals:  Vitals:   09/21/17 1050 09/21/17 1100  BP: 126/71 129/78  Pulse: (!) 55 (!) 57  Resp: 13 13  Temp:    SpO2: 100% 100%    Last Pain:  Vitals:   09/21/17 1030  TempSrc: Tympanic  PainSc:                  Alphonsus Sias

## 2017-09-21 NOTE — Transfer of Care (Signed)
Immediate Anesthesia Transfer of Care Note  Patient: Brendan Larson  Procedure(s) Performed: ESOPHAGOGASTRODUODENOSCOPY (EGD) WITH PROPOFOL (N/A ) COLONOSCOPY WITH PROPOFOL (N/A )  Patient Location: PACU and Endoscopy Unit  Anesthesia Type:General  Level of Consciousness: drowsy  Airway & Oxygen Therapy: Patient Spontanous Breathing and Patient connected to nasal cannula oxygen  Post-op Assessment: Report given to RN and Post -op Vital signs reviewed and stable  Post vital signs: Reviewed and stable  Last Vitals:  Vitals Value Taken Time  BP 116/72 09/21/2017 10:39 AM  Temp 36.1 C 09/21/2017 10:39 AM  Pulse 52 09/21/2017 10:39 AM  Resp 13 09/21/2017 10:39 AM  SpO2 100 % 09/21/2017 10:39 AM  Vitals shown include unvalidated device data.  Last Pain:  Vitals:   09/21/17 1030  TempSrc: Tympanic  PainSc:       Patients Stated Pain Goal: 0 (72/62/03 5597)  Complications: No apparent anesthesia complications

## 2017-09-21 NOTE — Op Note (Signed)
[facility] Gastroenterology Patient Name: Brendan Larson Procedure Date: 09/21/2017 9:43 AM MRN: [id number] Account #: [id number] Date of Birth: Dec 02, 1955 Admit Type: Outpatient Age: 62 Room: [facility] ENDO ROOM 2 Gender: Male Note Status: Finalized Procedure:            Colonoscopy Indications:          Family history of colon cancer in a first-degree                        relative, Family history of colonic polyps in a                        first-degree relative Providers:            Lollie Sails, MD Referring MD:         Irven Easterly. Kary Kos, MD (Referring MD) Medicines:            Monitored Anesthesia Care Complications:        No immediate complications. Procedure:            Pre-Anesthesia Assessment:                       - ASA Grade Assessment: III - A patient with severe                        systemic disease.                       After obtaining informed consent, the colonoscope was                        passed under direct vision. Throughout the procedure,                        the patient's blood pressure, pulse, and oxygen                        saturations were monitored continuously. The                        Colonoscope was introduced through the anus and                        advanced to the the cecum, identified by appendiceal                        orifice and ileocecal valve. The colonoscopy was                        performed without difficulty. The patient tolerated the                        procedure well. The quality of the bowel preparation                        was good. Findings:      Multiple medium-mouthed diverticula were found in the sigmoid colon,       descending colon, transverse colon and ascending colon.      A 5 mm polyp was found in the cecum. The polyp was sessile. The polyp  was removed with a cold snare. Resection and retrieval were complete.      A 1 mm polyp was found in the distal rectum. The polyp was  sessile. The       polyp was removed with a cold biopsy forceps. Resection and retrieval       were complete.      The digital rectal exam was normal.      The retroflexed view of the distal rectum and anal verge was normal and       showed no anal or rectal abnormalities otherwise.      The exam was otherwise without abnormality. Impression:           - Diverticulosis in the sigmoid colon, in the                        descending colon, in the transverse colon and in the                        ascending colon.                       - One 5 mm polyp in the cecum, removed with a cold                        snare. Resected and retrieved.                       - One 1 mm polyp in the rectum, removed with a cold                        biopsy forceps. Resected and retrieved.                       - The distal rectum and anal verge are normal on                        retroflexion view.                       - The examination was otherwise normal. Recommendation:       - Discharge patient to home.                       - Soft diet today, then advance as tolerated to advance                        diet as tolerated. Procedure Code(s):    --- Professional ---                       [phone], Colonoscopy, flexible; with removal of tumor(s),                        polyp(s), or other lesion(s) by snare technique                       58850, 78, Colonoscopy, flexible; with biopsy, single                        or multiple Diagnosis Code(s):    --- Professional ---  D12.0, Benign neoplasm of cecum                       K62.1, Rectal polyp                       Z80.0, Family history of malignant neoplasm of                        digestive organs                       Z83.71, Family history of colonic polyps                       K57.30, Diverticulosis of large intestine without                        perforation or abscess without bleeding CPT copyright 2017 American Medical  Association. All rights reserved. The codes documented in this report are preliminary and upon coder review may  be revised to meet current compliance requirements. Lollie Sails, MD 09/21/2017 10:33:59 AM This report has been signed electronically. Number of Addenda: 0 Note Initiated On: 09/21/2017 9:43 AM Scope Withdrawal Time: 0 hours 17 minutes 12 seconds  Total Procedure Duration: 0 hours 24 minutes 36 seconds       [facility]

## 2017-09-21 NOTE — Anesthesia Preprocedure Evaluation (Signed)
Anesthesia Evaluation  Patient identified by MRN, date of birth, ID band Patient awake    Reviewed: Allergy & Precautions, H&P , NPO status , reviewed documented beta blocker date and time   Airway Mallampati: II  TM Distance: >3 FB Neck ROM: full    Dental  (+) Chipped   Pulmonary asthma , sleep apnea , former smoker,    Pulmonary exam normal        Cardiovascular hypertension, Normal cardiovascular exam+ Valvular Problems/Murmurs      Neuro/Psych PSYCHIATRIC DISORDERS Depression    GI/Hepatic   Endo/Other    Renal/GU      Musculoskeletal   Abdominal   Peds  Hematology   Anesthesia Other Findings Past Medical History: No date: Arthritis No date: Asthma No date: Cancer [facility])     Comment:  PROSTATE WITH METS TO BONE 07/28/2017: Chronic left shoulder pain No date: Depression No date: Heart murmur     Comment:  Mitral Valve Prolapse 07/28/2017: Hip pain, chronic, right No date: Hyperlipidemia No date: Hypertension No date: Sleep apnea  Past Surgical History: No date: TONSILECTOMY, ADENOIDECTOMY, BILATERAL MYRINGOTOMY AND TUBES No date: TONSILLECTOMY  BMI    Body Mass Index:  28.09 kg/m      Reproductive/Obstetrics                            Anesthesia Physical Anesthesia Plan  ASA: III  Anesthesia Plan: General   Post-op Pain Management:    Induction:   PONV Risk Score and Plan: Treatment may vary due to age or medical condition and TIVA  Airway Management Planned:   Additional Equipment:   Intra-op Plan:   Post-operative Plan:   Informed Consent: I have reviewed the patients History and Physical, chart, labs and discussed the procedure including the risks, benefits and alternatives for the proposed anesthesia with the patient or authorized representative who has indicated his/her understanding and acceptance.   Dental Advisory Given  Plan Discussed with:  CRNA  Anesthesia Plan Comments:         Anesthesia Quick Evaluation

## 2017-09-22 ENCOUNTER — Encounter: Payer: Self-pay | Admitting: Gastroenterology

## 2017-09-22 LAB — SURGICAL PATHOLOGY

## 2017-09-22 MED ORDER — ABIRATERONE 250 MG TABLET
ORAL_TABLET | Freq: Every day | ORAL | 6 refills | 0 days | Status: CP
Start: 2017-09-22 — End: 2017-11-16

## 2017-09-22 MED ORDER — PREDNISONE 5 MG TABLET
ORAL_TABLET | Freq: Every day | ORAL | 11 refills | 0.00000 days | Status: CP
Start: 2017-09-22 — End: 2017-11-21

## 2017-10-02 ENCOUNTER — Ambulatory Visit: Admit: 2017-10-02 | Discharge: 2017-10-03 | Payer: MEDICARE

## 2017-10-02 DIAGNOSIS — C61 Malignant neoplasm of prostate: Principal | ICD-10-CM

## 2017-10-02 DIAGNOSIS — C7951 Secondary malignant neoplasm of bone: Secondary | ICD-10-CM

## 2017-10-02 LAB — CBC W/ AUTO DIFF
BASOPHILS ABSOLUTE COUNT: 0 10*9/L (ref 0.0–0.1)
BASOPHILS RELATIVE PERCENT: 0.7 %
EOSINOPHILS ABSOLUTE COUNT: 0.1 10*9/L (ref 0.0–0.4)
HEMATOCRIT: 36.4 % — ABNORMAL LOW (ref 41.0–53.0)
HEMOGLOBIN: 12.2 g/dL — ABNORMAL LOW (ref 13.5–17.5)
LARGE UNSTAINED CELLS: 2 % (ref 0–4)
LYMPHOCYTES ABSOLUTE COUNT: 1.6 10*9/L (ref 1.5–5.0)
MEAN CORPUSCULAR HEMOGLOBIN CONC: 33.5 g/dL (ref 31.0–37.0)
MEAN CORPUSCULAR HEMOGLOBIN: 30.4 pg (ref 26.0–34.0)
MEAN CORPUSCULAR VOLUME: 90.7 fL (ref 80.0–100.0)
MONOCYTES ABSOLUTE COUNT: 0.4 10*9/L (ref 0.2–0.8)
NEUTROPHILS ABSOLUTE COUNT: 3.2 10*9/L (ref 2.0–7.5)
NEUTROPHILS RELATIVE PERCENT: 57.8 %
PLATELET COUNT: 153 10*9/L (ref 150–440)
RED BLOOD CELL COUNT: 4.01 10*12/L — ABNORMAL LOW (ref 4.50–5.90)
RED CELL DISTRIBUTION WIDTH: 15 % (ref 12.0–15.0)
WBC ADJUSTED: 5.5 10*9/L (ref 4.5–11.0)

## 2017-10-02 LAB — COMPREHENSIVE METABOLIC PANEL
ALBUMIN: 3.9 g/dL (ref 3.5–5.0)
ALKALINE PHOSPHATASE: 138 U/L — ABNORMAL HIGH (ref 38–126)
ALT (SGPT): 25 U/L (ref 19–72)
ANION GAP: 6 mmol/L — ABNORMAL LOW (ref 9–15)
AST (SGOT): 27 U/L (ref 19–55)
BILIRUBIN TOTAL: 0.4 mg/dL (ref 0.0–1.2)
BLOOD UREA NITROGEN: 17 mg/dL (ref 7–21)
BUN / CREAT RATIO: 21
CALCIUM: 9.6 mg/dL (ref 8.5–10.2)
CO2: 29 mmol/L (ref 22.0–30.0)
CREATININE: 0.81 mg/dL (ref 0.70–1.30)
EGFR MDRD AF AMER: >=60 mL/min/{1.73_m2} (ref >=60)
GLUCOSE RANDOM: 107 mg/dL (ref 65–179)
POTASSIUM: 4 mmol/L (ref 3.5–5.0)
PROTEIN TOTAL: 6.6 g/dL (ref 6.5–8.3)
SODIUM: 143 mmol/L (ref 135–145)

## 2017-10-02 LAB — BILIRUBIN TOTAL: Bilirubin:MCnc:Pt:Ser/Plas:Qn:: 0.4

## 2017-10-02 LAB — PROSTATE SPECIFIC ANTIGEN: Prostate specific Ag:MCnc:Pt:Ser/Plas:Qn:: 2.21

## 2017-10-02 LAB — MEAN PLATELET VOLUME: Lab: 7.3

## 2017-10-02 LAB — BILIRUBIN DIRECT: Bilirubin.glucuronidated:MCnc:Pt:Ser/Plas:Qn:: <0.1

## 2017-10-15 ENCOUNTER — Other Ambulatory Visit: Payer: Self-pay

## 2017-10-15 ENCOUNTER — Encounter: Payer: Self-pay | Admitting: Emergency Medicine

## 2017-10-15 ENCOUNTER — Emergency Department
Admission: EM | Admit: 2017-10-15 | Discharge: 2017-10-15 | Disposition: A | Discharge status: Home / Self Care | Payer: Federal, State, Local not specified - PPO | Attending: Emergency Medicine | Admitting: Emergency Medicine

## 2017-10-15 DIAGNOSIS — C61 Malignant neoplasm of prostate: Secondary | ICD-10-CM | POA: Insufficient documentation

## 2017-10-15 DIAGNOSIS — C7951 Secondary malignant neoplasm of bone: Secondary | ICD-10-CM | POA: Insufficient documentation

## 2017-10-15 DIAGNOSIS — T783XXA Angioneurotic edema, initial encounter: Secondary | ICD-10-CM | POA: Insufficient documentation

## 2017-10-15 DIAGNOSIS — E785 Hyperlipidemia, unspecified: Secondary | ICD-10-CM | POA: Insufficient documentation

## 2017-10-15 DIAGNOSIS — L509 Urticaria, unspecified: Secondary | ICD-10-CM

## 2017-10-15 DIAGNOSIS — I10 Essential (primary) hypertension: Secondary | ICD-10-CM | POA: Diagnosis not present

## 2017-10-15 DIAGNOSIS — Z87891 Personal history of nicotine dependence: Secondary | ICD-10-CM | POA: Insufficient documentation

## 2017-10-15 DIAGNOSIS — T7840XA Allergy, unspecified, initial encounter: Secondary | ICD-10-CM | POA: Diagnosis not present

## 2017-10-15 DIAGNOSIS — Z79899 Other long term (current) drug therapy: Secondary | ICD-10-CM | POA: Diagnosis not present

## 2017-10-15 LAB — BASIC METABOLIC PANEL
Anion gap: 6 (ref 5–15)
BUN: 14 mg/dL (ref 6–20)
CHLORIDE: 103 mmol/L (ref 101–111)
CO2: 27 mmol/L (ref 22–32)
Calcium: 8.8 mg/dL — ABNORMAL LOW (ref 8.9–10.3)
Creatinine, Ser: 0.78 mg/dL (ref 0.61–1.24)
GFR calc Af Amer: >60 mL/min (ref >60)
Glucose, Bld: 121 mg/dL — ABNORMAL HIGH (ref 65–99)
Potassium: 3.7 mmol/L (ref 3.5–5.1)
SODIUM: 136 mmol/L (ref 135–145)

## 2017-10-15 LAB — CBC WITH DIFFERENTIAL/PLATELET
BASOS ABS: 0.1 10*3/uL (ref 0–0.1)
Basophils Relative: 1 %
EOS ABS: 0.3 10*3/uL (ref 0–0.7)
Eosinophils Relative: 4 %
HCT: 38.4 % — ABNORMAL LOW (ref 40.0–52.0)
HEMOGLOBIN: 13.3 g/dL (ref 13.0–18.0)
LYMPHS ABS: 1.1 10*3/uL (ref 1.0–3.6)
LYMPHS PCT: 17 %
MCH: 31.1 pg (ref 26.0–34.0)
MCHC: 34.6 g/dL (ref 32.0–36.0)
MCV: 89.7 fL (ref 80.0–100.0)
Monocytes Absolute: 0.7 10*3/uL (ref 0.2–1.0)
Monocytes Relative: 10 %
NEUTROS PCT: 68 %
Neutro Abs: 4.7 10*3/uL (ref 1.4–6.5)
PLATELETS: 160 10*3/uL (ref 150–440)
RBC: 4.28 MIL/uL — ABNORMAL LOW (ref 4.40–5.90)
RDW: 14.8 % — ABNORMAL HIGH (ref 11.5–14.5)
WBC: 6.9 10*3/uL (ref 3.8–10.6)

## 2017-10-15 MED ORDER — METHYLPREDNISOLONE SODIUM SUCC 125 MG IJ SOLR
125.0000 mg | Freq: Once | INTRAMUSCULAR | Status: AC
  Administered 2017-10-15: 125 mg via INTRAVENOUS
  Filled 2017-10-15: qty 2

## 2017-10-15 MED ORDER — DIPHENHYDRAMINE HCL 50 MG/ML IJ SOLN
50.0000 mg | Freq: Once | INTRAMUSCULAR | Status: AC
  Administered 2017-10-15: 50 mg via INTRAVENOUS
  Filled 2017-10-15: qty 1

## 2017-10-15 MED ORDER — FAMOTIDINE IN NACL 20-0.9 MG/50ML-% IV SOLN
20.0000 mg | Freq: Once | INTRAVENOUS | Status: AC
  Administered 2017-10-15: 20 mg via INTRAVENOUS
  Filled 2017-10-15: qty 50

## 2017-10-15 MED ORDER — PREDNISONE 10 MG (21) PO TBPK: ORAL_TABLET | ORAL | 0 refills | Status: DC

## 2017-10-15 NOTE — ED Notes (Signed)
See triage note  States he was started on a new Cancer med about 1 month ago  Developed rash/hives a few days ago  Positive itching  Then this am noticed some swelling to upper lip.  No diff swallowing  Speaking full sentences

## 2017-10-15 NOTE — ED Triage Notes (Signed)
Woke up with hives this am and uper lip is swelling.  No throat involvement.  Talked to oncologist at [facility] and they told him to come in.

## 2017-10-15 NOTE — Discharge Instructions (Addendum)
Your exam is consistent with urticaria and angioedema. This is most likely caused by one of the medications you are taking. It is unclear at this time, but lisinopril is in a class of  blood pressure medicines known to commonly cause angioedema. Your other relatively new medicines may also be possible allergens. Stop the lisinopril and follow-up with your primary provider for further management. Follow-up with your oncologist to determine what course to take with your other medicines. Take the steroid taper as directed and dose OTC Benadryl and Pepcid/Zantac as needed. Return to the ED as needed.

## 2017-10-15 NOTE — ED Provider Notes (Signed)
[facility] Emergency Department Provider Note ____________________________________________  Time seen: 1035  I have reviewed the triage vital signs and the nursing notes.  HISTORY  Chief Complaint  Allergic Reaction  HPI Brendan Larson is a 62 y.o. male presents to the ED accompanied by his wife, for evaluation of a presumed drug reaction.  The patient has been taking prednisone, Flomax, and Zytiga for his prostate cancer over the last 5 weeks.  He also has a long-standing prescription for lisinopril, lovastatin, and omeprazole.  Denies any previous knowledge of any drug allergies or sensitivities.  He describes noting yesterday the onset of what he described as hives to the upper chest and torso primarily under the arm and along the arms.  He also had an area developed on his buttocks and inner thighs.  This morning he awoke with some subtle swelling to the upper lip.  He denies any difficulty breathing, swallowing, or controlling secretions.  Also denies any shortness of breath, chest pain, fevers, chills, sweats.  He has been able to tolerate solid and liquid foods without difficulty.  He did call the on-call provider for his oncology service, and they suggested he present to the ED for further evaluation and management.  Past Medical History:  Diagnosis Date  . Arthritis   . Asthma   . Cancer (HCC)    PROSTATE WITH METS TO BONE  . Chronic left shoulder pain 07/28/2017  . Depression   . Heart murmur    Mitral Valve Prolapse  . Hip pain, chronic, right 07/28/2017  . Hyperlipidemia   . Hypertension   . Sleep apnea     There are no active problems to display for this patient.   Past Surgical History:  Procedure Laterality Date  . COLONOSCOPY WITH PROPOFOL N/A 09/21/2017   Procedure: COLONOSCOPY WITH PROPOFOL;  Surgeon: Lollie Sails, MD;  Location: [facility] ENDOSCOPY;  Service: Endoscopy;  Laterality: N/A;  . ESOPHAGOGASTRODUODENOSCOPY (EGD) WITH PROPOFOL  N/A 09/21/2017   Procedure: ESOPHAGOGASTRODUODENOSCOPY (EGD) WITH PROPOFOL;  Surgeon: Lollie Sails, MD;  Location: [facility] ENDOSCOPY;  Service: Endoscopy;  Laterality: N/A;  . TONSILECTOMY, ADENOIDECTOMY, BILATERAL MYRINGOTOMY AND TUBES    . TONSILLECTOMY      Prior to Admission medications   Medication Sig Start Date End Date Taking? Authorizing Provider  abiraterone acetate (ZYTIGA) 250 MG tablet Take 1,000 mg by mouth daily. Take on an empty stomach 1 hour before or 2 hours after a meal   Yes [provider]  tamsulosin (FLOMAX) 0.4 MG CAPS capsule Take 0.4 mg by mouth.   Yes [provider]  lisinopril-hydrochlorothiazide (PRINZIDE,ZESTORETIC) 10-12.5 MG tablet Take by mouth. 05/10/17 06/09/17  [provider]  Multiple Vitamin (MULTIVITAMIN) capsule Take by mouth.    [provider]  nystatin-triamcinolone (MYCOLOG II) cream Apply topically. 05/10/17 05/10/18  [provider]  omega-3 acid ethyl esters (LOVAZA) 1 g capsule Take by mouth 2 (two) times daily.    [provider]  omeprazole (PRILOSEC) 20 MG capsule Take by mouth. 05/10/17 05/10/18  [provider]  pantoprazole (PROTONIX) 40 MG tablet Take by mouth. 06/20/17   [provider]  predniSONE (DELTASONE) 5 MG tablet Take 5 mg by mouth daily with breakfast.    [provider]  predniSONE (STERAPRED UNI-PAK 21 TAB) 10 MG (21) TBPK tablet 6-day taper as directed. 10/15/17   Syesha Thaw, Dannielle Karvonen, PA-C  rosuvastatin (CRESTOR) 10 MG tablet Take by mouth. 05/16/17   [provider]  Allergies Penicillins and Statins  Family History  Problem Relation Age of Onset  . Hypertension Mother   . Thyroid disease Mother   . Breast cancer Mother   . Colon cancer Mother   . Hyperlipidemia Mother   . Hypertension Father   . Macular degeneration Father   . Prostate cancer Father   . Skin cancer Father   . Diabetes Mellitus II Father   . Hyperlipidemia  Father   . Heart attack Father   . Hypertension Sister   . Alcohol abuse Maternal Grandmother   . Alzheimer's disease Maternal Grandmother   . Bladder Cancer Neg Hx   . Kidney cancer Neg Hx     Social History Social History   Tobacco Use  . Smoking status: Former Research scientist (life sciences)  . Smokeless tobacco: Never Used  Substance Use Topics  . Alcohol use: Yes    Alcohol/week: 3.6 oz    Types: 2 Glasses of wine, 2 Cans of beer, 2 Shots of liquor per week  . Drug use: No    Review of Systems  Constitutional: Negative for fever. Eyes: Negative for visual changes. ENT: Negative for sore throat. Upper lip swelling as noted Cardiovascular: Negative for chest pain. Respiratory: Negative for shortness of breath. Gastrointestinal: Negative for abdominal pain, vomiting and diarrhea. Genitourinary: Negative for dysuria. Musculoskeletal: Negative for back pain. Skin: Positive for rash. Neurological: Negative for headaches, focal weakness or numbness. ____________________________________________  PHYSICAL EXAM:  VITAL SIGNS: ED Triage Vitals  Enc Vitals Group     BP 10/15/17 1028 (!) 160/74     Pulse Rate 10/15/17 1028 80     Resp 10/15/17 1028 18     Temp 10/15/17 1028 98.3 F (36.8 C)     Temp Source 10/15/17 1028 Oral     SpO2 10/15/17 1028 99 %     Weight 10/15/17 1027 210 lb (95.3 kg)     Height 10/15/17 1027 6' (1.829 m)     Head Circumference --      Peak Flow --      Pain Score 10/15/17 1025 0     Pain Loc --      Pain Edu? --      Excl. in [place]? --     Constitutional: Alert and oriented. Well appearing and in no distress. Head: Normocephalic and atraumatic. Eyes: Conjunctivae are normal. PERRL. Normal extraocular movements Ears: Canals clear. TMs intact bilaterally. Nose: No congestion/rhinorrhea/epistaxis. Mouth/Throat: Mucous membranes are moist.  Upper lip with focal swelling and erythema noted centrally consistent with angioedema.  Uvula is midline and tonsils are flat.   No oropharyngeal lesions, edema, or erythema is appreciated.  No sublingual edema is noted. Neck: Supple. No thyromegaly. Hematological/Lymphatic/Immunological: No cervical lymphadenopathy. Cardiovascular: Normal rate, regular rhythm. Normal distal pulses. Respiratory: Normal respiratory effort. No wheezes/rales/rhonchi. Gastrointestinal: Soft and nontender. No distention. Musculoskeletal: Nontender with normal range of motion in all extremities.  Neurologic:  Normal gait without ataxia. Normal speech and language. No gross focal neurologic deficits are appreciated. Skin:  Skin is warm, dry and intact.  Patient with large maculopapular patches of erythematous whelps consistent with hives noted to the upper extremities and in the axilla bilaterally.  He has similar lesions along the gluteal cleft and scattered along the buttocks and inner thighs. Psychiatric: Mood and affect are normal. Patient exhibits appropriate insight and judgment. ____________________________________________   LABS (pertinent positives/negatives)  Labs Reviewed  CBC WITH DIFFERENTIAL/PLATELET - Abnormal; Notable for the following components:      Result  Value   RBC 4.28 (*)    HCT 38.4 (*)    RDW 14.8 (*)    All other components within normal limits  BASIC METABOLIC PANEL - Abnormal; Notable for the following components:   Glucose, Bld 121 (*)    Calcium 8.8 (*)    All other components within normal limits  ____________________________________________  PROCEDURES  Procedures Solumedrol 125 mg IVP Diphenhydramine 50 mg IVP Famotidine 20 mg IVPB ____________________________________________  INITIAL IMPRESSION / ASSESSMENT AND PLAN / ED COURSE  Patient with ED evaluation of a presumed drug reaction or idiopathic hives.  Patient presents with swelling to the upper lip as well as an urticaria to the trunk and extremities.  He has noted improvement following IV administration of antihistamines and steroids.   Patient will be discharged to follow-up with his oncologist as scheduled tomorrow.  He is advised at this time that lisinopril is commonly known to cause such angioedema and he should consider discontinuing that medication and contacting his primary provider tomorrow.  He will follow-up with his oncologist about whether his new prostate/urology medications could be the culprit.  He otherwise is stable at discharge and has verbalized understanding of any serious return precautions.  Is discharged with a prescription for a prednisone taper pack. ____________________________________________  FINAL CLINICAL IMPRESSION(S) / ED DIAGNOSES  Final diagnoses:  Angioedema, initial encounter  Urticaria  Allergic reaction to drug, initial encounter      Melvenia Needles, PA-C 10/15/17 1851    Delman Kitten, MD 10/25/17 2125

## 2017-10-16 ENCOUNTER — Encounter: Admit: 2017-10-16 | Discharge: 2017-10-17 | Payer: MEDICARE

## 2017-10-16 ENCOUNTER — Ambulatory Visit: Admit: 2017-10-16 | Discharge: 2017-10-17 | Payer: MEDICARE | Attending: Internal Medicine | Primary: Internal Medicine

## 2017-10-16 DIAGNOSIS — C7951 Secondary malignant neoplasm of bone: Secondary | ICD-10-CM

## 2017-10-16 DIAGNOSIS — C61 Malignant neoplasm of prostate: Principal | ICD-10-CM

## 2017-10-16 MED ORDER — PREDNISONE 10 MG TABLETS IN A DOSE PACK
ORAL_TABLET | Freq: Every day | ORAL | 0 refills | 0 days | Status: CP
Start: 2017-10-16 — End: 2017-11-19

## 2017-10-16 MED ORDER — AMLODIPINE 5 MG TABLET
ORAL_TABLET | Freq: Every day | ORAL | 11 refills | 0 days | Status: CP
Start: 2017-10-16 — End: 2018-08-08

## 2017-10-24 ENCOUNTER — Encounter: Admit: 2017-10-24 | Discharge: 2017-10-25 | Payer: MEDICARE

## 2017-10-24 DIAGNOSIS — C7951 Secondary malignant neoplasm of bone: Secondary | ICD-10-CM

## 2017-10-24 DIAGNOSIS — C61 Malignant neoplasm of prostate: Principal | ICD-10-CM

## 2017-11-16 ENCOUNTER — Encounter: Admit: 2017-11-16 | Discharge: 2017-11-17 | Payer: MEDICARE

## 2017-11-16 ENCOUNTER — Encounter: Admit: 2017-11-16 | Discharge: 2017-11-17 | Payer: MEDICARE | Attending: Internal Medicine | Primary: Internal Medicine

## 2017-11-16 DIAGNOSIS — C7951 Secondary malignant neoplasm of bone: Secondary | ICD-10-CM

## 2017-11-16 DIAGNOSIS — C61 Malignant neoplasm of prostate: Principal | ICD-10-CM

## 2017-11-16 MED ORDER — HYDROCHLOROTHIAZIDE 12.5 MG TABLET
ORAL_TABLET | Freq: Every day | ORAL | 11 refills | 0.00000 days | Status: CP
Start: 2017-11-16 — End: 2018-11-20

## 2017-11-19 MED ORDER — ABIRATERONE 250 MG TABLET
ORAL_TABLET | Freq: Every day | ORAL | 6 refills | 0 days | Status: CP
Start: 2017-11-19 — End: 2017-11-21

## 2017-11-21 MED ORDER — DEXAMETHASONE 4 MG TABLET
ORAL_TABLET | ORAL | 6 refills | 0.00000 days | Status: CP
Start: 2017-11-21 — End: 2018-05-14

## 2017-11-21 MED ORDER — PROCHLORPERAZINE MALEATE 10 MG TABLET
ORAL_TABLET | Freq: Four times a day (QID) | ORAL | prn refills | 0 days | Status: CP | PRN
Start: 2017-11-21 — End: 2018-05-14

## 2017-11-30 ENCOUNTER — Encounter: Admit: 2017-11-30 | Discharge: 2017-12-01 | Payer: MEDICARE

## 2017-11-30 DIAGNOSIS — C7951 Secondary malignant neoplasm of bone: Secondary | ICD-10-CM

## 2017-11-30 DIAGNOSIS — C61 Malignant neoplasm of prostate: Principal | ICD-10-CM

## 2017-12-21 ENCOUNTER — Encounter: Admit: 2017-12-21 | Discharge: 2017-12-22 | Payer: MEDICARE

## 2017-12-21 ENCOUNTER — Encounter: Admit: 2017-12-21 | Discharge: 2017-12-22 | Payer: MEDICARE | Attending: Internal Medicine | Primary: Internal Medicine

## 2017-12-21 DIAGNOSIS — C7951 Secondary malignant neoplasm of bone: Secondary | ICD-10-CM

## 2017-12-21 DIAGNOSIS — C61 Malignant neoplasm of prostate: Principal | ICD-10-CM

## 2018-01-11 ENCOUNTER — Encounter: Admit: 2018-01-11 | Discharge: 2018-01-11 | Payer: MEDICARE

## 2018-01-11 ENCOUNTER — Ambulatory Visit: Admit: 2018-01-11 | Discharge: 2018-01-11 | Payer: MEDICARE | Attending: Internal Medicine | Primary: Internal Medicine

## 2018-01-11 DIAGNOSIS — C61 Malignant neoplasm of prostate: Principal | ICD-10-CM

## 2018-01-11 DIAGNOSIS — C7951 Secondary malignant neoplasm of bone: Secondary | ICD-10-CM

## 2018-01-11 MED ORDER — ONDANSETRON 8 MG DISINTEGRATING TABLET
ORAL_TABLET | Freq: Two times a day (BID) | ORAL | 1 refills | 0 days | Status: CP | PRN
Start: 2018-01-11 — End: 2018-04-05

## 2018-02-01 ENCOUNTER — Ambulatory Visit: Admit: 2018-02-01 | Discharge: 2018-02-01 | Payer: MEDICARE

## 2018-02-01 ENCOUNTER — Encounter: Admit: 2018-02-01 | Discharge: 2018-02-01 | Payer: MEDICARE | Attending: Internal Medicine | Primary: Internal Medicine

## 2018-02-01 ENCOUNTER — Other Ambulatory Visit: Admit: 2018-02-01 | Discharge: 2018-02-01 | Payer: MEDICARE

## 2018-02-01 DIAGNOSIS — C61 Malignant neoplasm of prostate: Principal | ICD-10-CM

## 2018-02-01 DIAGNOSIS — C7951 Secondary malignant neoplasm of bone: Secondary | ICD-10-CM

## 2018-02-22 ENCOUNTER — Encounter: Admit: 2018-02-22 | Discharge: 2018-02-23 | Payer: MEDICARE

## 2018-02-22 ENCOUNTER — Encounter: Admit: 2018-02-22 | Discharge: 2018-02-23 | Payer: MEDICARE | Attending: Internal Medicine | Primary: Internal Medicine

## 2018-02-22 DIAGNOSIS — C61 Malignant neoplasm of prostate: Principal | ICD-10-CM

## 2018-02-22 DIAGNOSIS — C7951 Secondary malignant neoplasm of bone: Secondary | ICD-10-CM

## 2018-03-15 ENCOUNTER — Encounter: Admit: 2018-03-15 | Discharge: 2018-03-16 | Payer: MEDICARE | Attending: Internal Medicine | Primary: Internal Medicine

## 2018-03-15 ENCOUNTER — Encounter: Admit: 2018-03-15 | Discharge: 2018-03-16 | Payer: MEDICARE

## 2018-03-15 ENCOUNTER — Encounter: Admit: 2018-03-15 | Discharge: 2018-03-16 | Payer: MEDICARE | Attending: Children | Primary: Children

## 2018-03-15 DIAGNOSIS — Z803 Family history of malignant neoplasm of breast: Secondary | ICD-10-CM

## 2018-03-15 DIAGNOSIS — C7951 Secondary malignant neoplasm of bone: Secondary | ICD-10-CM

## 2018-03-15 DIAGNOSIS — C61 Malignant neoplasm of prostate: Principal | ICD-10-CM

## 2018-04-05 ENCOUNTER — Encounter: Admit: 2018-04-05 | Discharge: 2018-04-05 | Payer: MEDICARE

## 2018-04-05 ENCOUNTER — Encounter: Admit: 2018-04-05 | Discharge: 2018-04-05 | Payer: MEDICARE | Attending: Family | Primary: Family

## 2018-04-05 ENCOUNTER — Other Ambulatory Visit: Admit: 2018-04-05 | Discharge: 2018-04-05 | Payer: MEDICARE

## 2018-04-05 DIAGNOSIS — C61 Malignant neoplasm of prostate: Principal | ICD-10-CM

## 2018-04-05 DIAGNOSIS — C7951 Secondary malignant neoplasm of bone: Secondary | ICD-10-CM

## 2018-04-05 MED ORDER — ONDANSETRON 8 MG DISINTEGRATING TABLET
ORAL_TABLET | Freq: Three times a day (TID) | ORAL | 1 refills | 0 days | Status: CP | PRN
Start: 2018-04-05 — End: 2018-07-09

## 2018-04-30 ENCOUNTER — Encounter: Admit: 2018-04-30 | Discharge: 2018-04-30 | Payer: MEDICARE

## 2018-04-30 ENCOUNTER — Encounter: Admit: 2018-04-30 | Discharge: 2018-05-13 | Payer: MEDICARE

## 2018-04-30 ENCOUNTER — Other Ambulatory Visit: Admit: 2018-04-30 | Discharge: 2018-04-30 | Payer: MEDICARE

## 2018-04-30 DIAGNOSIS — C7951 Secondary malignant neoplasm of bone: Secondary | ICD-10-CM

## 2018-04-30 DIAGNOSIS — C61 Malignant neoplasm of prostate: Principal | ICD-10-CM

## 2018-05-10 ENCOUNTER — Encounter: Admit: 2018-05-10 | Discharge: 2018-05-11 | Payer: MEDICARE | Attending: Internal Medicine | Primary: Internal Medicine

## 2018-05-10 DIAGNOSIS — C7951 Secondary malignant neoplasm of bone: Secondary | ICD-10-CM

## 2018-05-10 DIAGNOSIS — C61 Malignant neoplasm of prostate: Principal | ICD-10-CM

## 2018-05-16 MED ORDER — TAMSULOSIN 0.4 MG CAPSULE
ORAL_CAPSULE | Freq: Every day | ORAL | 3 refills | 0 days | Status: CP
Start: 2018-05-16 — End: 2018-08-14

## 2018-06-28 ENCOUNTER — Other Ambulatory Visit: Payer: Self-pay | Admitting: Sports Medicine

## 2018-06-28 ENCOUNTER — Other Ambulatory Visit (HOSPITAL_COMMUNITY): Payer: Self-pay | Admitting: Sports Medicine

## 2018-06-28 DIAGNOSIS — G8929 Other chronic pain: Secondary | ICD-10-CM

## 2018-06-28 DIAGNOSIS — M25562 Pain in left knee: Principal | ICD-10-CM

## 2018-06-28 DIAGNOSIS — M25462 Effusion, left knee: Secondary | ICD-10-CM

## 2018-07-04 ENCOUNTER — Ambulatory Visit
Admission: RE | Admit: 2018-07-04 | Discharge: 2018-07-04 | Disposition: A | Discharge status: Home / Self Care | Payer: Federal, State, Local not specified - PPO | Source: Ambulatory Visit | Attending: Sports Medicine | Admitting: Sports Medicine

## 2018-07-04 ENCOUNTER — Other Ambulatory Visit: Payer: Self-pay

## 2018-07-04 DIAGNOSIS — G8929 Other chronic pain: Secondary | ICD-10-CM

## 2018-07-04 DIAGNOSIS — M25562 Pain in left knee: Secondary | ICD-10-CM | POA: Insufficient documentation

## 2018-07-04 DIAGNOSIS — M25462 Effusion, left knee: Secondary | ICD-10-CM | POA: Diagnosis present

## 2018-07-04 IMAGING — MR MR KNEE*L* W/O CM
6 series · 40 of 40 positions shown · non-contrast
Comparison: None.

CLINICAL DATA: Injured knee while lifting a heavy object in
[DATE]. Persistent pain.

EXAM:
MRI OF THE LEFT KNEE WITHOUT CONTRAST
TECHNIQUE: Multiplanar, multisequence MR imaging of the knee was performed. No
intravenous contrast was administered.

[Series 8: T2 fat-sat · axial · left · 4.0mm · 0.50mm/px · z∈[-74,+51]mm · 5 of 26 slices shown (1 of 3)]
[im 1/26]
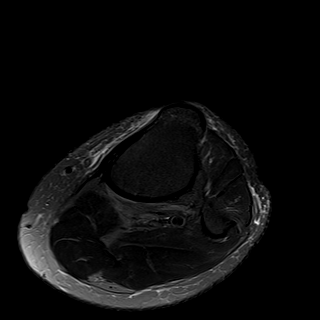
[im 7/26]
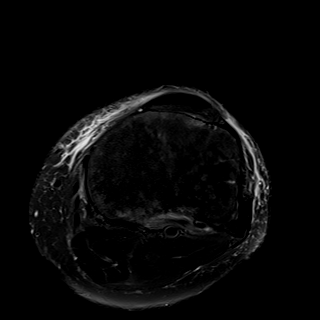
[im 13/26]
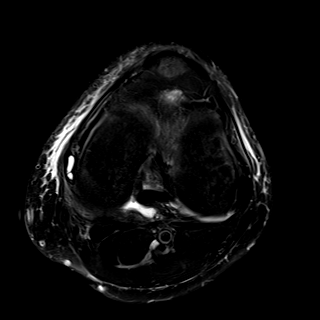
[im 19/26]
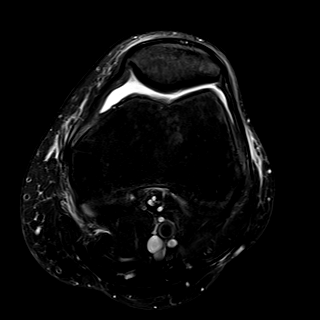
[im 26/26]
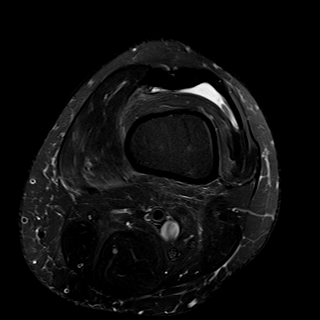

[Series 9: T1 · coronal · left · 4.0mm · 0.59mm/px · 7 of 30 slices shown]
[im 1/30]
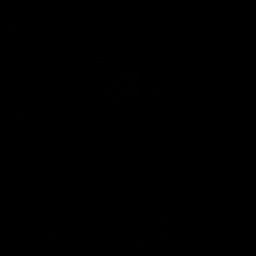
[im 5/30]
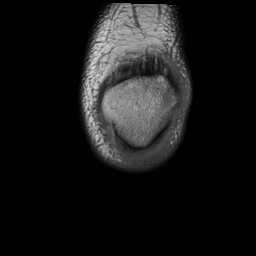
[im 10/30]
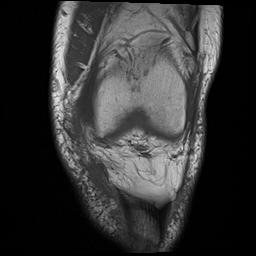
[im 15/30]
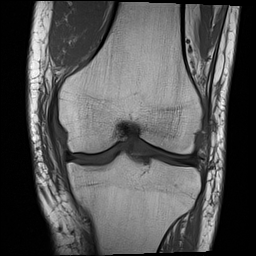
[im 20/30]
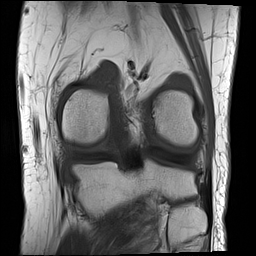
[im 25/30]
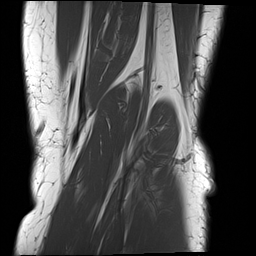
[im 30/30]
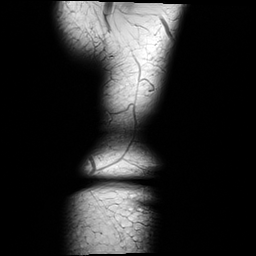

[Series 10: T2 fat-sat · coronal · left · 4.0mm · 0.59mm/px · 6 of 29 slices shown (2 of 3)]
[im 1/29]
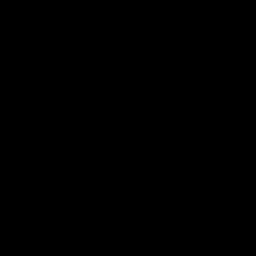
[im 6/29]
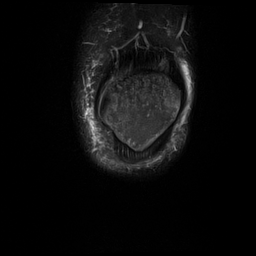
[im 12/29]
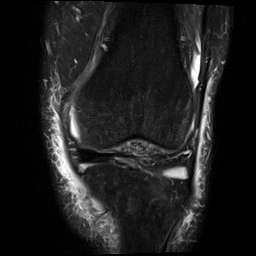
[im 17/29]
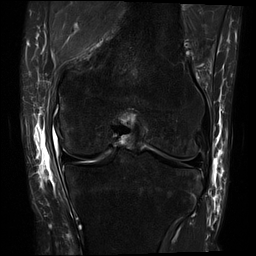
[im 23/29]
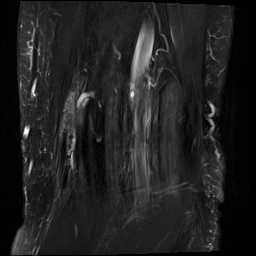
[im 29/29]
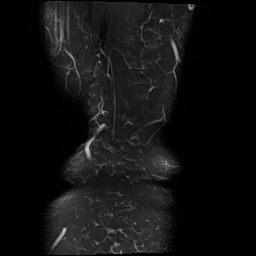

[Series 11: PD fat-sat · coronal · left · 4.0mm · 0.59mm/px · 7 of 30 slices shown (1 of 2)]
[im 1/30]
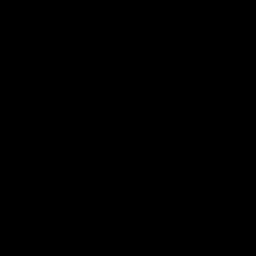
[im 5/30]
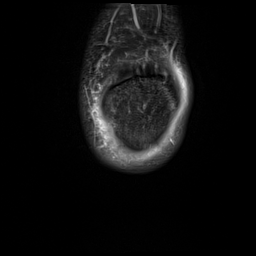
[im 10/30]
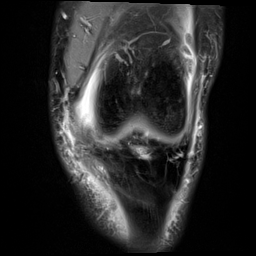
[im 15/30]
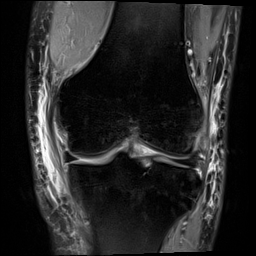
[im 20/30]
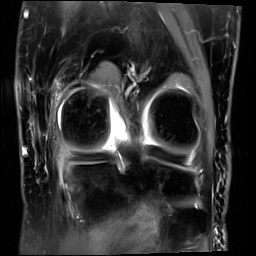
[im 25/30]
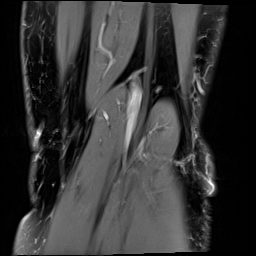
[im 30/30]
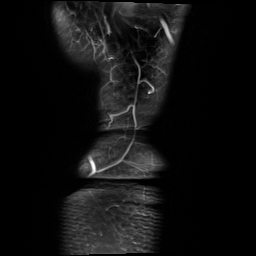

[Series 12: PD fat-sat · sagittal · left · 3.0mm · 0.59mm/px · 7 of 31 slices shown (2 of 2)]
[im 1/31]
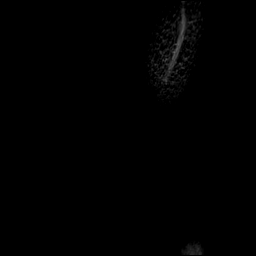
[im 6/31]
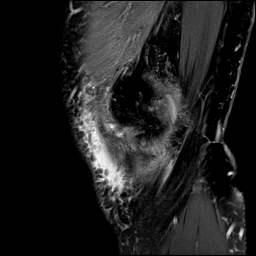
[im 11/31]
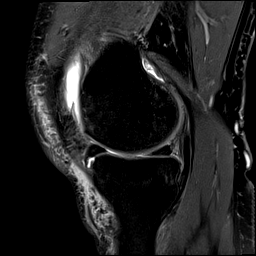
[im 16/31]
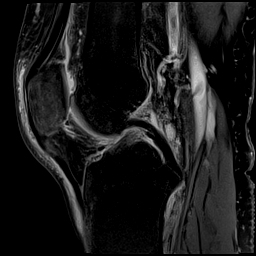
[im 21/31]
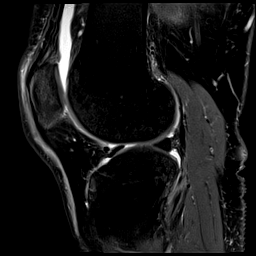
[im 26/31]
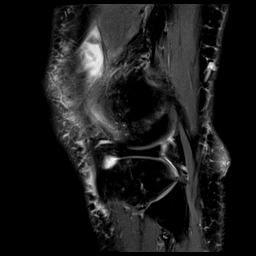
[im 31/31]
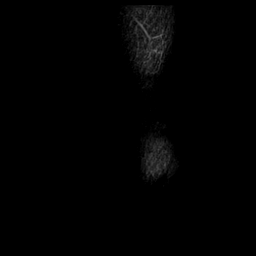

[Series 13: T2 fat-sat · sagittal · left · 3.0mm · 0.59mm/px · 8 of 34 slices shown (3 of 3)]
[im 1/34]
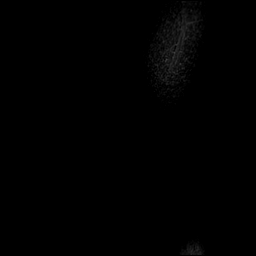
[im 5/34]
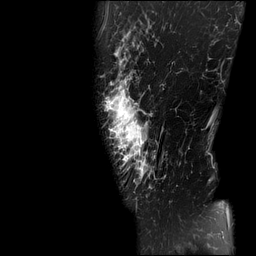
[im 10/34]
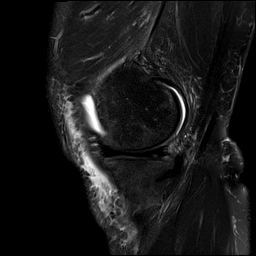
[im 15/34]
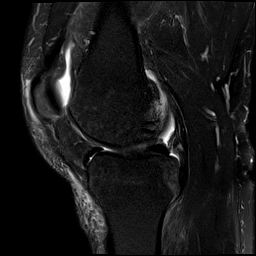
[im 19/34]
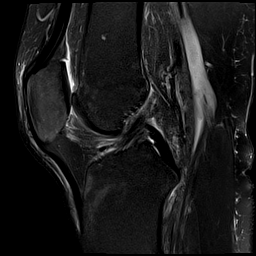
[im 24/34]
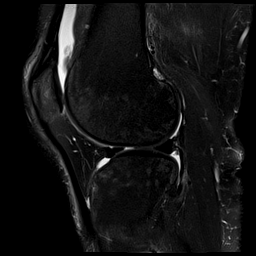
[im 29/34]
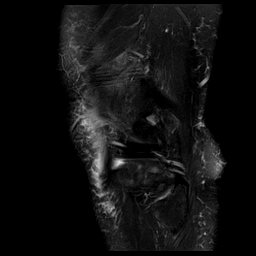
[im 34/34]
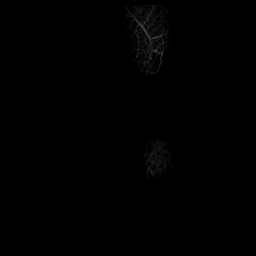

[40 of 40 positions shown; findings below may reference images not displayed]

FINDINGS: MENISCI

Medial meniscus: Oblique coursing inferior articular surface tear
involving the posterior horn mid body junction region.

Lateral meniscus:  Intact

LIGAMENTS

Cruciates:  Intact

Collaterals:  Intact.  Moderate MCL and pes anserine bursitis.

CARTILAGE

Patellofemoral:  Mild degenerative chondrosis.

Medial:  Mild degenerative chondrosis.

Lateral:  Mild degenerative chondrosis.

Joint: Small joint effusion. Moderate edema/inflammation is noted in
the posterior aspect of Hoffa's fat.

Popliteal Fossa:  No popliteal mass or Baker's cyst.

Extensor Mechanism: The patella retinacular structures are intact
and the quadriceps and patellar tendons are intact. There is
moderate proximal patellar tendinopathy noted.

Bones:  No acute bony findings or osteochondral lesion.

Other: Normal knee musculature.
IMPRESSION: 1. Inferior articular surface tear involving the posterior horn mid
body junction region of the medial meniscus.
2. Intact ligamentous structures and no acute bony findings.
3. Moderate MCL and pes anserine bursitis.
4. Mild proc apart mental degenerative chondrosis.
5. Proximal patellar tendinopathy.

## 2018-07-05 ENCOUNTER — Encounter: Admit: 2018-07-05 | Discharge: 2018-07-06 | Payer: MEDICARE

## 2018-07-05 ENCOUNTER — Encounter: Admit: 2018-07-05 | Discharge: 2018-07-06 | Payer: MEDICARE | Attending: Internal Medicine | Primary: Internal Medicine

## 2018-07-05 DIAGNOSIS — C61 Malignant neoplasm of prostate: Principal | ICD-10-CM

## 2018-07-05 DIAGNOSIS — I10 Essential (primary) hypertension: Principal | ICD-10-CM

## 2018-07-05 DIAGNOSIS — R5383 Other fatigue: Principal | ICD-10-CM

## 2018-07-05 DIAGNOSIS — R350 Frequency of micturition: Principal | ICD-10-CM

## 2018-07-05 DIAGNOSIS — R110 Nausea: Principal | ICD-10-CM

## 2018-07-05 DIAGNOSIS — Z808 Family history of malignant neoplasm of other organs or systems: Principal | ICD-10-CM

## 2018-07-05 DIAGNOSIS — Z880 Allergy status to penicillin: Principal | ICD-10-CM

## 2018-07-05 DIAGNOSIS — Z5111 Encounter for antineoplastic chemotherapy: Principal | ICD-10-CM

## 2018-07-05 DIAGNOSIS — L509 Urticaria, unspecified: Principal | ICD-10-CM

## 2018-07-05 DIAGNOSIS — M25569 Pain in unspecified knee: Principal | ICD-10-CM

## 2018-07-05 DIAGNOSIS — C7951 Secondary malignant neoplasm of bone: Principal | ICD-10-CM

## 2018-07-05 DIAGNOSIS — E7800 Pure hypercholesterolemia, unspecified: Principal | ICD-10-CM

## 2018-07-05 DIAGNOSIS — Z8042 Family history of malignant neoplasm of prostate: Principal | ICD-10-CM

## 2018-08-08 MED ORDER — AMLODIPINE 5 MG TABLET
ORAL_TABLET | Freq: Every day | ORAL | 11 refills | 0 days | Status: CP
Start: 2018-08-08 — End: 2018-12-12

## 2018-09-20 ENCOUNTER — Other Ambulatory Visit: Payer: Self-pay

## 2018-09-20 ENCOUNTER — Encounter: Payer: Self-pay | Admitting: *Deleted

## 2018-09-25 ENCOUNTER — Other Ambulatory Visit
Admission: RE | Admit: 2018-09-25 | Discharge: 2018-09-25 | Disposition: A | Discharge status: Home / Self Care | Payer: Federal, State, Local not specified - PPO | Source: Ambulatory Visit | Attending: Orthopedic Surgery | Admitting: Orthopedic Surgery

## 2018-09-25 ENCOUNTER — Other Ambulatory Visit: Payer: Self-pay

## 2018-09-25 DIAGNOSIS — Z1159 Encounter for screening for other viral diseases: Secondary | ICD-10-CM | POA: Insufficient documentation

## 2018-09-26 LAB — NOVEL CORONAVIRUS, NAA (HOSP ORDER, SEND-OUT TO REF LAB; TAT 18-24 HRS): SARS-CoV-2, NAA: NOT DETECTED

## 2018-09-28 ENCOUNTER — Ambulatory Visit: Payer: Federal, State, Local not specified - PPO | Admitting: Anesthesiology

## 2018-09-28 ENCOUNTER — Encounter
Admission: RE | Disposition: A | Discharge status: Home / Self Care | Payer: Self-pay | Source: Home / Self Care | Attending: Orthopedic Surgery

## 2018-09-28 ENCOUNTER — Other Ambulatory Visit: Payer: Self-pay

## 2018-09-28 ENCOUNTER — Ambulatory Visit
Admission: RE | Admit: 2018-09-28 | Discharge: 2018-09-28 | Disposition: A | Discharge status: Home / Self Care | Payer: Federal, State, Local not specified - PPO | Attending: Orthopedic Surgery | Admitting: Orthopedic Surgery

## 2018-09-28 DIAGNOSIS — I10 Essential (primary) hypertension: Secondary | ICD-10-CM | POA: Insufficient documentation

## 2018-09-28 DIAGNOSIS — X500XXA Overexertion from strenuous movement or load, initial encounter: Secondary | ICD-10-CM | POA: Diagnosis not present

## 2018-09-28 DIAGNOSIS — C61 Malignant neoplasm of prostate: Secondary | ICD-10-CM | POA: Diagnosis not present

## 2018-09-28 DIAGNOSIS — G4730 Sleep apnea, unspecified: Secondary | ICD-10-CM | POA: Insufficient documentation

## 2018-09-28 DIAGNOSIS — Y9389 Activity, other specified: Secondary | ICD-10-CM | POA: Insufficient documentation

## 2018-09-28 DIAGNOSIS — S83232A Complex tear of medial meniscus, current injury, left knee, initial encounter: Secondary | ICD-10-CM | POA: Diagnosis not present

## 2018-09-28 DIAGNOSIS — K219 Gastro-esophageal reflux disease without esophagitis: Secondary | ICD-10-CM | POA: Diagnosis not present

## 2018-09-28 HISTORY — DX: Gastro-esophageal reflux disease without esophagitis: K21.9

## 2018-09-28 HISTORY — PX: KNEE ARTHROSCOPY WITH MEDIAL MENISECTOMY: SHX5651

## 2018-09-28 HISTORY — DX: Respiratory tuberculosis unspecified: A15.9

## 2018-09-28 HISTORY — DX: Motion sickness, initial encounter: T75.3XXA

## 2018-09-28 SURGERY — ARTHROSCOPY, KNEE, WITH MEDIAL MENISCECTOMY
Anesthesia: General | Site: Knee | Laterality: Left

## 2018-09-28 MED ORDER — OXYCODONE HCL 5 MG/5ML PO SOLN: 5.0000 mg | Freq: Once | ORAL | Status: AC | PRN

## 2018-09-28 MED ORDER — ASPIRIN EC 325 MG PO TBEC: 325.0000 mg | DELAYED_RELEASE_TABLET | Freq: Every day | ORAL | 0 refills | Status: AC

## 2018-09-28 MED ORDER — ONDANSETRON 4 MG PO TBDP: 4.0000 mg | ORAL_TABLET | Freq: Three times a day (TID) | ORAL | 0 refills | Status: DC | PRN

## 2018-09-28 MED ORDER — ONDANSETRON HCL 4 MG/2ML IJ SOLN
INTRAMUSCULAR | Status: DC | PRN
  Administered 2018-09-28: 4 mg via INTRAVENOUS

## 2018-09-28 MED ORDER — MIDAZOLAM HCL 2 MG/2ML IJ SOLN
1.0000 mg | Freq: Once | INTRAMUSCULAR | Status: AC
  Administered 2018-09-28: 1 mg via INTRAVENOUS

## 2018-09-28 MED ORDER — ONDANSETRON HCL 4 MG/2ML IJ SOLN: 4.0000 mg | Freq: Once | INTRAMUSCULAR | Status: DC | PRN

## 2018-09-28 MED ORDER — HYDROCODONE-ACETAMINOPHEN 5-325 MG PO TABS: 1.0000 | ORAL_TABLET | ORAL | 0 refills | Status: DC | PRN

## 2018-09-28 MED ORDER — GLYCOPYRROLATE 0.2 MG/ML IJ SOLN
INTRAMUSCULAR | Status: DC | PRN
  Administered 2018-09-28: 0.2 mg via INTRAVENOUS

## 2018-09-28 MED ORDER — MIDAZOLAM HCL 5 MG/5ML IJ SOLN
INTRAMUSCULAR | Status: DC | PRN
  Administered 2018-09-28: 2 mg via INTRAVENOUS

## 2018-09-28 MED ORDER — FENTANYL CITRATE (PF) 100 MCG/2ML IJ SOLN
INTRAMUSCULAR | Status: DC | PRN
  Administered 2018-09-28: 25 ug via INTRAVENOUS

## 2018-09-28 MED ORDER — IBUPROFEN 800 MG PO TABS: 800.0000 mg | ORAL_TABLET | Freq: Three times a day (TID) | ORAL | 0 refills | Status: AC

## 2018-09-28 MED ORDER — LIDOCAINE HCL (CARDIAC) PF 100 MG/5ML IV SOSY
PREFILLED_SYRINGE | INTRAVENOUS | Status: DC | PRN
  Administered 2018-09-28: 40 mg via INTRATRACHEAL

## 2018-09-28 MED ORDER — ACETAMINOPHEN 500 MG PO TABS: 1000.0000 mg | ORAL_TABLET | Freq: Three times a day (TID) | ORAL | 2 refills | Status: DC

## 2018-09-28 MED ORDER — LACTATED RINGERS IV SOLN
INTRAVENOUS | Status: DC
  Administered 2018-09-28: 11:00:00 via INTRAVENOUS

## 2018-09-28 MED ORDER — ACETAMINOPHEN 325 MG PO TABS: 325.0000 mg | ORAL_TABLET | ORAL | Status: DC | PRN

## 2018-09-28 MED ORDER — OXYCODONE HCL 5 MG PO TABS
5.0000 mg | ORAL_TABLET | Freq: Once | ORAL | Status: AC | PRN
  Administered 2018-09-28: 5 mg via ORAL

## 2018-09-28 MED ORDER — ACETAMINOPHEN 160 MG/5ML PO SOLN: 325.0000 mg | ORAL | Status: DC | PRN

## 2018-09-28 MED ORDER — FENTANYL CITRATE (PF) 100 MCG/2ML IJ SOLN: 25.0000 ug | INTRAMUSCULAR | Status: DC | PRN

## 2018-09-28 MED ORDER — DEXMEDETOMIDINE HCL 200 MCG/2ML IV SOLN
INTRAVENOUS | Status: DC | PRN
  Administered 2018-09-28: 4 ug via INTRAVENOUS

## 2018-09-28 MED ORDER — BUPIVACAINE HCL (PF) 0.5 % IJ SOLN
INTRAMUSCULAR | Status: DC | PRN
  Administered 2018-09-28: 1 mL
  Administered 2018-09-28: 4 mL

## 2018-09-28 MED ORDER — LIDOCAINE-EPINEPHRINE 1 %-1:100000 IJ SOLN
INTRAMUSCULAR | Status: DC | PRN
  Administered 2018-09-28: 1 mL
  Administered 2018-09-28: 4 mL

## 2018-09-28 MED ORDER — PROPOFOL 10 MG/ML IV BOLUS
INTRAVENOUS | Status: DC | PRN
  Administered 2018-09-28: 200 mg via INTRAVENOUS

## 2018-09-28 MED ORDER — CEFAZOLIN SODIUM-DEXTROSE 2-3 GM-%(50ML) IV SOLR
INTRAVENOUS | Status: DC | PRN
  Administered 2018-09-28: 2 g via INTRAVENOUS

## 2018-09-28 MED ORDER — DEXAMETHASONE SODIUM PHOSPHATE 4 MG/ML IJ SOLN
INTRAMUSCULAR | Status: DC | PRN
  Administered 2018-09-28: 4 mg via INTRAVENOUS

## 2018-09-28 MED ORDER — EPHEDRINE SULFATE 50 MG/ML IJ SOLN
INTRAMUSCULAR | Status: DC | PRN
  Administered 2018-09-28: 10 mg via INTRAVENOUS

## 2018-09-28 SURGICAL SUPPLY — 37 items
ADAPTER IRRIG TUBE 2 SPIKE SOL (ADAPTER) ×4 IMPLANT
BLADE SURG SZ11 CARB STEEL (BLADE) ×2 IMPLANT
BNDG COHESIVE 4X5 TAN STRL (GAUZE/BANDAGES/DRESSINGS) ×2 IMPLANT
BNDG ESMARK 6X12 TAN STRL LF (GAUZE/BANDAGES/DRESSINGS) ×2 IMPLANT
BUR RADIUS 3.5 (BURR) IMPLANT
CHLORAPREP W/TINT 26 (MISCELLANEOUS) ×1 IMPLANT
COOLER POLAR GLACIER W/PUMP (MISCELLANEOUS) ×2 IMPLANT
COVER LIGHT HANDLE UNIVERSAL (MISCELLANEOUS) ×4 IMPLANT
CUFF TOURN SGL QUICK 30 (TOURNIQUET CUFF) ×1
CUFF TRNQT CYL 30X4X21-28X (TOURNIQUET CUFF) IMPLANT
DRAPE IMP U-DRAPE 54X76 (DRAPES) ×2 IMPLANT
DRAPE U-SHAPE 48X52 POLY STRL (PACKS) ×2 IMPLANT
GAUZE SPONGE 4X4 12PLY STRL (GAUZE/BANDAGES/DRESSINGS) ×2 IMPLANT
GLOVE BIO SURGEON STRL SZ7.5 (GLOVE) ×4 IMPLANT
GLOVE BIOGEL PI IND STRL 8 (GLOVE) ×1 IMPLANT
GLOVE BIOGEL PI INDICATOR 8 (GLOVE) ×2
GOWN STRL REIN 2XL XLG LVL4 (GOWN DISPOSABLE) ×1 IMPLANT
GOWN STRL REUS W/ TWL LRG LVL3 (GOWN DISPOSABLE) ×1 IMPLANT
GOWN STRL REUS W/TWL LRG LVL3 (GOWN DISPOSABLE) ×1
IV LACTATED RINGER IRRG 3000ML (IV SOLUTION) ×4
IV LR IRRIG 3000ML ARTHROMATIC (IV SOLUTION) ×4 IMPLANT
KIT TURNOVER KIT A (KITS) ×2 IMPLANT
MAT ABSORB  FLUID 56X50 GRAY (MISCELLANEOUS) ×1
MAT ABSORB FLUID 56X50 GRAY (MISCELLANEOUS) ×1 IMPLANT
NEPTUNE MANIFOLD (MISCELLANEOUS) ×2 IMPLANT
NS IRRIG 500ML POUR BTL (IV SOLUTION) ×2 IMPLANT
PACK ARTHROSCOPY KNEE (MISCELLANEOUS) ×2 IMPLANT
PAD WRAPON POLAR KNEE (MISCELLANEOUS) ×1 IMPLANT
PADDING CAST BLEND 6X4 STRL (MISCELLANEOUS) ×1 IMPLANT
PADDING STRL CAST 6IN (MISCELLANEOUS) ×1
SET TUBE SUCT SHAVER OUTFL 24K (TUBING) ×2 IMPLANT
SET TUBE TIP INTRA-ARTICULAR (MISCELLANEOUS) ×2 IMPLANT
SUT ETHILON 3-0 FS-10 30 BLK (SUTURE) ×2
SUTURE EHLN 3-0 FS-10 30 BLK (SUTURE) ×1 IMPLANT
TUBING ARTHRO INFLOW-ONLY STRL (TUBING) ×2 IMPLANT
WAND WEREWOLF FLOW 90D (MISCELLANEOUS) ×1 IMPLANT
WRAPON POLAR PAD KNEE (MISCELLANEOUS) ×2

## 2018-09-28 NOTE — Discharge Instructions (Signed)
Arthroscopic Knee Surgery - Partial Meniscectomy   Post-Op Instructions   1. Bracing or crutches: Crutches will be provided at the time of discharge from the surgery center if you do not already have them.   2. Ice: You may be provided with a device [facility]) that allows you to ice the affected area effectively. Otherwise you can ice manually.    3. Driving:  Plan on not driving for at least two weeks. Please note that you are advised NOT to drive while taking narcotic pain medications as you may be impaired and unsafe to drive.   4. Activity: Ankle pumps several times an hour while awake to prevent blood clots. Weight bearing: as tolerated. Use crutches for as needed (usually ~1 week or less) until pain allows you to ambulate without a limp. Bending and straightening the knee is unlimited. Elevate knee above heart level as much as possible for one week. Avoid standing more than 5 minutes (consecutively) for the first week.  Avoid long distance travel for 2 weeks.  5. Medications:  - You have been provided a prescription for narcotic pain medicine. After surgery, take 1-2 narcotic tablets every 4 hours if needed for severe pain.  - You may take up to 3000mg /day of tylenol (acetaminophen). You can take 1000mg  3x/day. Please check your narcotic. If you have acetaminophen in your narcotic (each tablet will be 325mg ), be careful not to exceed a total of 3000mg /day of acetaminophen.  - A prescription for anti-nausea medication will be provided in case the narcotic medicine or anesthesia causes nausea - take 1 tablet every 6 hours only if nauseated.  - Take ibuprofen 800 mg every 8 hours WITH food to reduce post-operative knee swelling. DO NOT STOP IBUPROFEN POST-OP UNTIL INSTRUCTED TO DO SO at first post-op office visit (10-14 days after surgery). However, please discontinue if you have any abdominal discomfort after taking this.  - Take enteric coated aspirin 325 mg once daily for 2 weeks to prevent  blood clots.    6. Bandages: The physical therapist should change the bandages at the first post-op appointment. If needed, the dressing supplies have been provided to you.   7. Physical Therapy: 1-2 times per week for 6 weeks. Therapy typically starts on post operative Day 3 or 4. You have been provided an order for physical therapy. The therapist will provide home exercises.   8. Work: May return to full work usually around 2 weeks after 1st post-operative visit. May do light duty/desk job in approximately 1-2 weeks when off of narcotics, pain is well-controlled, and swelling has decreased. Labor intensive jobs may require 4-6 weeks to return.      9. Post-Op Appointments: Your first post-op appointment will be with Dr. Posey Pronto in approximately 2 weeks time.    If you find that they have not been scheduled please call the Orthopaedic Appointment front desk at [phone].    General Anesthesia, Adult, Care After This sheet gives you information about how to care for yourself after your procedure. Your health care provider may also give you more specific instructions. If you have problems or questions, contact your health care provider. What can I expect after the procedure? After the procedure, the following side effects are common:  Pain or discomfort at the IV site.  Nausea.  Vomiting.  Sore throat.  Trouble concentrating.  Feeling cold or chills.  Weak or tired.  Sleepiness and fatigue.  Soreness and body aches. These side effects can affect parts of  the body that were not involved in surgery. Follow these instructions at home:  For at least 24 hours after the procedure:  Have a responsible adult stay with you. It is important to have someone help care for you until you are awake and alert.  Rest as needed.  Do not: ? Participate in activities in which you could fall or become injured. ? Drive. ? Use heavy machinery. ? Drink alcohol. ? Take sleeping pills or  medicines that cause drowsiness. ? Make important decisions or sign legal documents. ? Take care of children on your own. Eating and drinking  Follow any instructions from your health care provider about eating or drinking restrictions.  When you feel hungry, start by eating small amounts of foods that are soft and easy to digest (bland), such as toast. Gradually return to your regular diet.  Drink enough fluid to keep your urine pale yellow.  If you vomit, rehydrate by drinking water, juice, or clear broth. General instructions  If you have sleep apnea, surgery and certain medicines can increase your risk for breathing problems. Follow instructions from your health care provider about wearing your sleep device: ? Anytime you are sleeping, including during daytime naps. ? While taking prescription pain medicines, sleeping medicines, or medicines that make you drowsy.  Return to your normal activities as told by your health care provider. Ask your health care provider what activities are safe for you.  Take over-the-counter and prescription medicines only as told by your health care provider.  If you smoke, do not smoke without supervision.  Keep all follow-up visits as told by your health care provider. This is important. Contact a health care provider if:  You have nausea or vomiting that does not get better with medicine.  You cannot eat or drink without vomiting.  You have pain that does not get better with medicine.  You are unable to pass urine.  You develop a skin rash.  You have a fever.  You have redness around your IV site that gets worse. Get help right away if:  You have difficulty breathing.  You have chest pain.  You have blood in your urine or stool, or you vomit blood. Summary  After the procedure, it is common to have a sore throat or nausea. It is also common to feel tired.  Have a responsible adult stay with you for the first 24 hours after general  anesthesia. It is important to have someone help care for you until you are awake and alert.  When you feel hungry, start by eating small amounts of foods that are soft and easy to digest (bland), such as toast. Gradually return to your regular diet.  Drink enough fluid to keep your urine pale yellow.  Return to your normal activities as told by your health care provider. Ask your health care provider what activities are safe for you. This information is not intended to replace advice given to you by your health care provider. Make sure you discuss any questions you have with your health care provider. Document Released: 07/25/2000 Document Revised: 12/02/2016 Document Reviewed: 12/02/2016 Elsevier Interactive Patient Education  2019 [place] for Discharge Teaching: EXPAREL (bupivacaine liposome injectable suspension)   Your surgeon or anesthesiologist gave you EXPAREL(bupivacaine) to help control your pain after surgery.   EXPAREL is a local anesthetic that provides pain relief by numbing the tissue around the surgical site.  EXPAREL is designed to release pain medication over time and can  control pain for up to 72 hours.  Depending on how you respond to EXPAREL, you may require less pain medication during your recovery.  Possible side effects:  Temporary loss of sensation or ability to move in the area where bupivacaine was injected.  Nausea, vomiting, constipation  Rarely, numbness and tingling in your mouth or lips, lightheadedness, or anxiety may occur.  Call your doctor right away if you think you may be experiencing any of these sensations, or if you have other questions regarding possible side effects.  Follow all other discharge instructions given to you by your surgeon or nurse. Eat a healthy diet and drink plenty of water or other fluids.

## 2018-09-28 NOTE — Transfer of Care (Signed)
Immediate Anesthesia Transfer of Care Note  Patient: Brendan Larson  Procedure(s) Performed: KNEE ARTHROSCOPY WITH PARTIAL  MEDIAL MENISECTOMY (Left Knee)  Patient Location: PACU  Anesthesia Type: General LMA  Level of Consciousness: awake, alert  and patient cooperative  Airway and Oxygen Therapy: Patient Spontanous Breathing and Patient connected to supplemental oxygen  Post-op Assessment: Post-op Vital signs reviewed, Patient's Cardiovascular Status Stable, Respiratory Function Stable, Patent Airway and No signs of Nausea or vomiting  Post-op Vital Signs: Reviewed and stable  Complications: No apparent anesthesia complications

## 2018-09-28 NOTE — H&P (Signed)
Paper H&P to be scanned into permanent record. H&P reviewed. No significant changes noted.  

## 2018-09-28 NOTE — Anesthesia Preprocedure Evaluation (Signed)
Anesthesia Evaluation  Patient identified by MRN, date of birth, ID band Patient awake    Reviewed: Allergy & Precautions, H&P , NPO status , Patient's Chart, lab work & pertinent test results  Airway Mallampati: II  TM Distance: >3 FB Neck ROM: full    Dental no notable dental hx.    Pulmonary asthma , sleep apnea , Current Smoker,    Pulmonary exam normal breath sounds clear to auscultation       Cardiovascular hypertension, Normal cardiovascular exam+ Valvular Problems/Murmurs MVP  Rhythm:regular Rate:Normal     Neuro/Psych PSYCHIATRIC DISORDERS    GI/Hepatic GERD  ,  Endo/Other    Renal/GU      Musculoskeletal   Abdominal   Peds  Hematology   Anesthesia Other Findings   Reproductive/Obstetrics                             Anesthesia Physical Anesthesia Plan  ASA: II  Anesthesia Plan: General LMA   Post-op Pain Management:    Induction:   PONV Risk Score and Plan: 2 and Ondansetron, Dexamethasone, Midazolam and Treatment may vary due to age or medical condition  Airway Management Planned:   Additional Equipment:   Intra-op Plan:   Post-operative Plan:   Informed Consent: I have reviewed the patients History and Physical, chart, labs and discussed the procedure including the risks, benefits and alternatives for the proposed anesthesia with the patient or authorized representative who has indicated his/her understanding and acceptance.       Plan Discussed with: CRNA  Anesthesia Plan Comments:         Anesthesia Quick Evaluation

## 2018-09-28 NOTE — Anesthesia Procedure Notes (Signed)
Procedure Name: LMA Insertion Date/Time: 09/28/2018 1:46 PM Performed by: Janna Arch, CRNA Pre-anesthesia Checklist: Patient identified, Emergency Drugs available, Suction available, Timeout performed and Patient being monitored Patient Re-evaluated:Patient Re-evaluated prior to induction Oxygen Delivery Method: Circle system utilized Preoxygenation: Pre-oxygenation with 100% oxygen Induction Type: IV induction LMA: LMA inserted LMA Size: 4.0 Number of attempts: 1 Placement Confirmation: positive ETCO2 and breath sounds checked- equal and bilateral Tube secured with: Tape

## 2018-09-28 NOTE — Op Note (Signed)
Operative Note    SURGERY DATE: 09/28/2018   PRE-OP DIAGNOSIS:  1. Left medial meniscus tear 2. Left tricompartmental degenerative changes   POST-OP DIAGNOSIS:  1. Left medial meniscus tear 2. Left tricompartmental degenerative changes   PROCEDURES:  1.  Left knee arthroscopy, partial medial meniscectomy 2.  Chondroplasty of patellofemoral, medial, and lateral compartments   SURGEON: Cato Mulligan, MD   ANESTHESIA: Gen   ESTIMATED BLOOD LOSS: minimal   TOTAL IV FLUIDS: per anesthesia   INDICATION(S):  Brendan Larson is a 63 y.o. male with signs and symptoms as well as MRI finding of medial meniscus tear.  He has failed conservative management.  After discussion of risks, benefits, and alternatives to surgery, the patient elected to proceed.   OPERATIVE FINDINGS:    Examination under anesthesia: A careful examination under anesthesia was performed.  Passive range of motion was: Hyperextension: 1.  Extension: 0.  Flexion: 130.  Lachman: normal. Pivot Shift: normal.  Posterior drawer: normal.  Varus stability in full extension: normal.  Varus stability in 30 degrees of flexion: normal.  Valgus stability in full extension: normal.  Valgus stability in 30 degrees of flexion: normal.   Intra-operative findings: A thorough arthroscopic examination of the knee was performed.  The findings are: 1. Suprapatellar pouch: Normal 2. Undersurface of median ridge: Grade 1 softening 3. Medial patellar facet: Grade 1 softening 4. Lateral patellar facet: Grade 2-3 degenerative changes 5. Trochlea: Grade 1 degenerative changes 6. Lateral gutter/popliteus tendon: Normal 7. Hoffa's fat pad: Inflamed 8. Medial gutter/plica: Normal 9. ACL: Normal 10. PCL: Normal 11. Medial meniscus: Complex tear with primarily horizontal tear pattern with small parrot-beak component. 12. Medial compartment cartilage: Grade 2 degenerative changes to the medial femoral condyle and tibial plateau 13. Lateral  meniscus: Normal 14. Lateral compartment cartilage: Grade 1-2 degenerative changes to the tibial plateau and lateral femoral condyle   OPERATIVE REPORT:     I identified Salome Spotted in the pre-operative holding area. I marked the operative knee with my initials. I reviewed the risks and benefits of the proposed surgical intervention and the patient (and/or patient's guardian) wished to proceed. The patient was transferred to the operative suite and placed in the supine position with all bony prominences padded.  Anesthesia was administered. Appropriate IV antibiotics were administered prior to incision. The extremity was then prepped and draped in standard fashion. A time out was performed confirming the correct extremity, correct patient, and correct procedure.   Arthroscopy portals were marked. Local anesthetic was injected to the planned portal sites. The anterolateral portal was established with an 11 blade.      The arthroscope was placed in the anterolateral portal and then into the suprapatellar pouch.  A diagnostic knee scope was completed with the above findings. The medial meniscus tear was identified.   Next the medial portal was established under needle localization. The MCL was pie-crusted to improve visualization of the posterior horn. The meniscal tear was debrided using an arthroscopic biter and an oscillating shaver until the meniscus had stable borders.  The inferior leaflet was detached from the remainder of the meniscus so it was debrided while leaving the superior leaflet of the posterior horn.  After debridement, approximately 60% of the posterior horn width of the superior leaflet remained.  A chondroplasty was performed of the medial compartment, lateral compartment, and patellofemoral compartment such that there were stable cartilage edges without any loose fragments of cartilage. Arthroscopic fluid was removed from the joint.   The  portals were closed with 3-0 Nylon suture.  Sterile dressings included Xeroform, 4x4s, Sof-Rol, and Bias wrap. A Polarcare was placed.  The patient was then awakened and taken to the PACU hemodynamically stable without complication.     POSTOPERATIVE PLAN: The patient will be discharged home today once they meet PACU criteria. Aspirin 325 mg daily was prescribed for 2 weeks for DVT prophylaxis.  Physical therapy will start on POD#3-4. Weight-bearing as tolerated. Follow up in 2 weeks per protocol.

## 2018-09-28 NOTE — Anesthesia Postprocedure Evaluation (Signed)
Anesthesia Post Note  Patient: Brendan Larson  Procedure(s) Performed: KNEE ARTHROSCOPY WITH PARTIAL  MEDIAL MENISECTOMY (Left Knee)  Patient location during evaluation: PACU Anesthesia Type: General Level of consciousness: awake and alert and oriented Pain management: satisfactory to patient Vital Signs Assessment: post-procedure vital signs reviewed and stable Respiratory status: spontaneous breathing, nonlabored ventilation and respiratory function stable Cardiovascular status: blood pressure returned to baseline and stable Postop Assessment: Adequate PO intake and No signs of nausea or vomiting Anesthetic complications: no    Raliegh Ip

## 2018-10-04 ENCOUNTER — Encounter: Admit: 2018-10-04 | Discharge: 2018-10-04 | Payer: MEDICARE

## 2018-10-04 ENCOUNTER — Ambulatory Visit: Admit: 2018-10-04 | Discharge: 2018-10-04 | Payer: MEDICARE

## 2018-10-04 ENCOUNTER — Telehealth: Admit: 2018-10-04 | Discharge: 2018-10-04 | Payer: MEDICARE | Attending: Internal Medicine | Primary: Internal Medicine

## 2018-10-04 DIAGNOSIS — C7951 Secondary malignant neoplasm of bone: Secondary | ICD-10-CM

## 2018-10-04 DIAGNOSIS — C61 Malignant neoplasm of prostate: Principal | ICD-10-CM

## 2018-11-08 ENCOUNTER — Other Ambulatory Visit: Payer: Self-pay | Admitting: Orthopedic Surgery

## 2018-11-08 DIAGNOSIS — Z9889 Other specified postprocedural states: Secondary | ICD-10-CM

## 2018-11-20 ENCOUNTER — Other Ambulatory Visit: Payer: Self-pay

## 2018-11-20 ENCOUNTER — Ambulatory Visit
Admission: RE | Admit: 2018-11-20 | Discharge: 2018-11-20 | Disposition: A | Discharge status: Home / Self Care | Payer: Federal, State, Local not specified - PPO | Source: Ambulatory Visit | Attending: Orthopedic Surgery | Admitting: Orthopedic Surgery

## 2018-11-20 DIAGNOSIS — Z9889 Other specified postprocedural states: Secondary | ICD-10-CM | POA: Diagnosis not present

## 2018-11-20 IMAGING — MR MRI OF THE RIGHT SHOULDER WITHOUT CONTRAST
5 series · 32 of 40 positions shown · non-contrast
Comparison: None.

CLINICAL DATA: Right shoulder pain and limited range of motion for
6 months. No known injury.

EXAM:
MRI OF THE RIGHT SHOULDER WITHOUT CONTRAST
TECHNIQUE: Multiplanar, multisequence MR imaging of the shoulder was performed.
No intravenous contrast was administered.

[Series 5: PD fat-sat · axial · right · 4.0mm · 0.55mm/px · z∈[-21,+108]mm · 8 of 28 slices shown (1 of 2)]
[im 1/28]
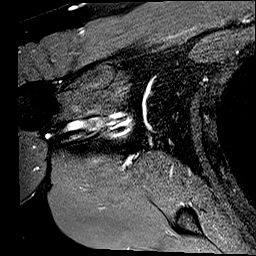
[im 4/28]
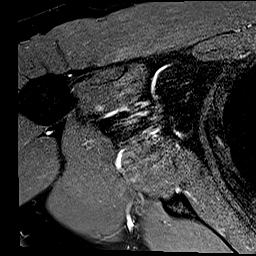
[im 10/28]
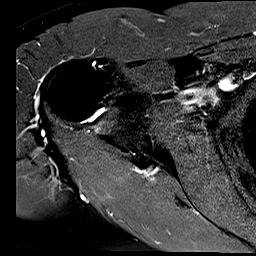
[im 13/28]
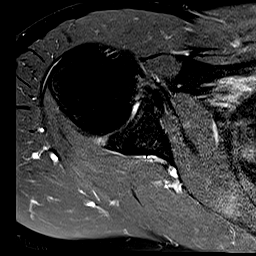
[im 16/28]
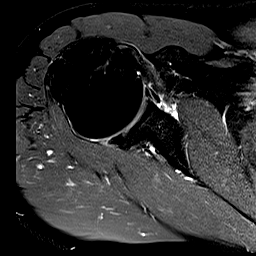
[im 19/28]
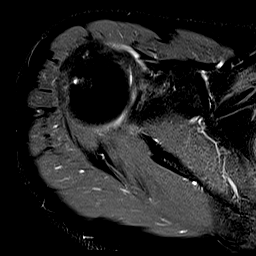
[im 25/28]
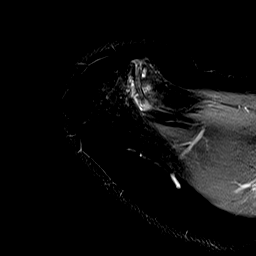
[im 28/28]
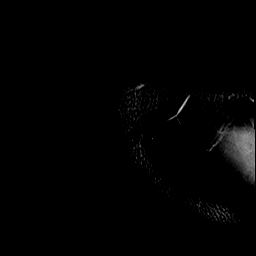

[Series 6: PD fat-sat · oblique · right · 4.0mm · 0.44mm/px · 8 of 26 slices shown (2 of 2)]
[im 1/26]
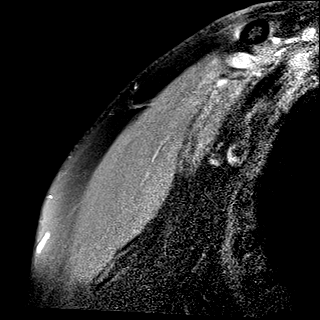
[im 4/26]
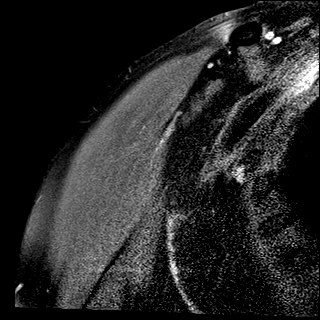
[im 8/26]
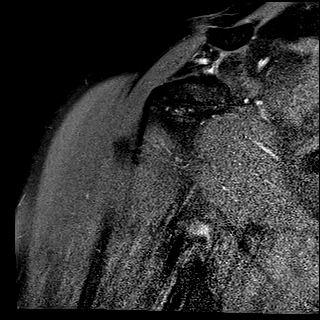
[im 11/26]
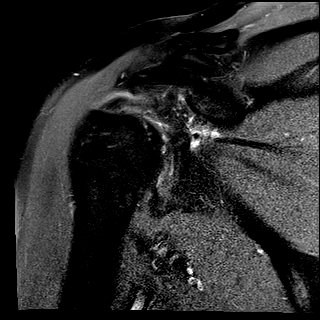
[im 15/26]
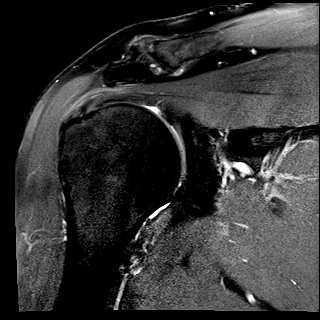
[im 18/26]
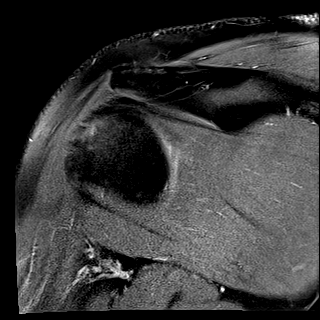
[im 22/26]
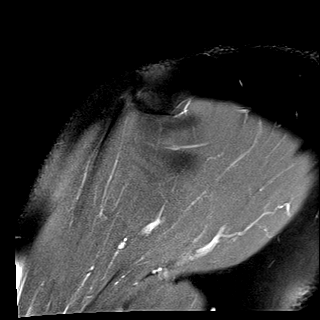
[im 26/26]
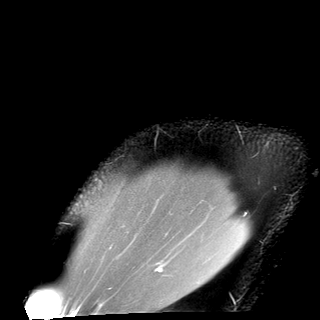

[Series 7: T2 fat-sat · oblique · right · 4.0mm · 0.44mm/px · 8 of 26 slices shown (1 of 2)]
[im 1/26]
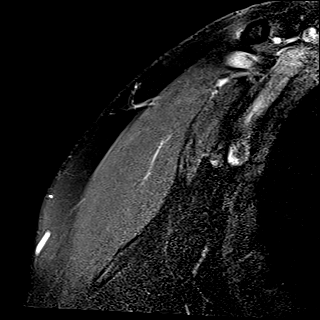
[im 4/26]
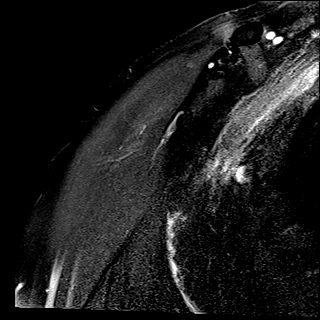
[im 8/26]
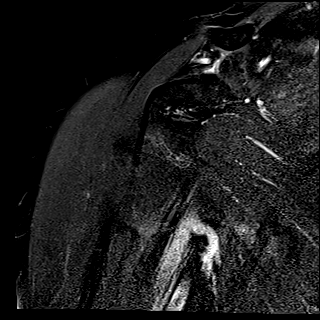
[im 11/26]
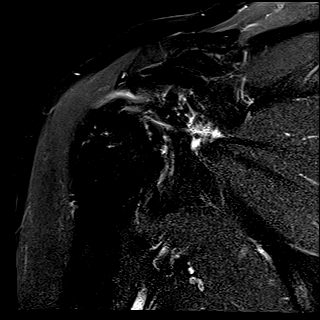
[im 15/26]
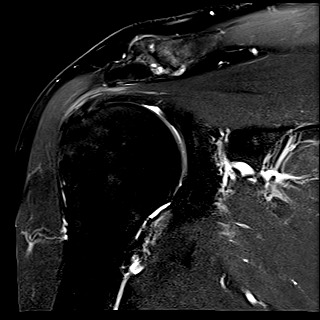
[im 18/26]
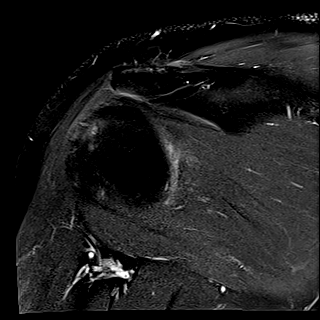
[im 22/26]
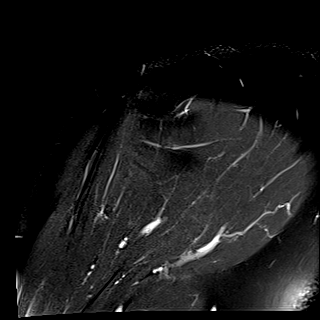
[im 26/26]
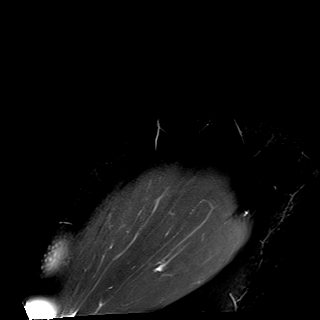

[Series 8: T2 fat-sat · oblique · right · 4.0mm · 0.23mm/px · 7 of 22 slices shown (2 of 2)]
[im 1/22]
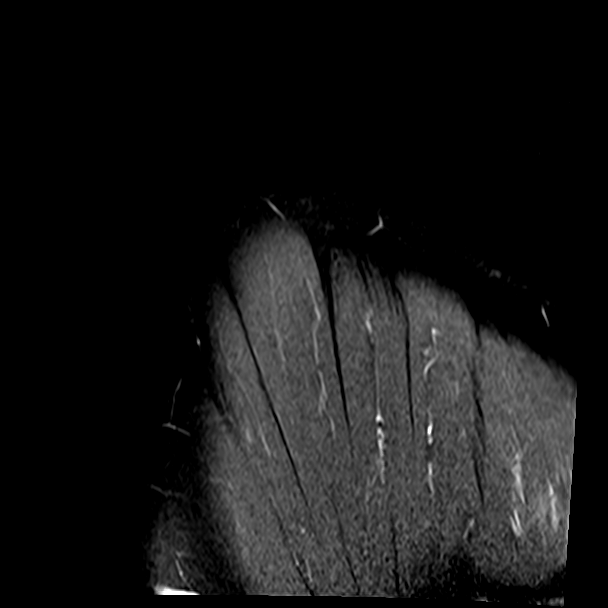
[im 4/22]
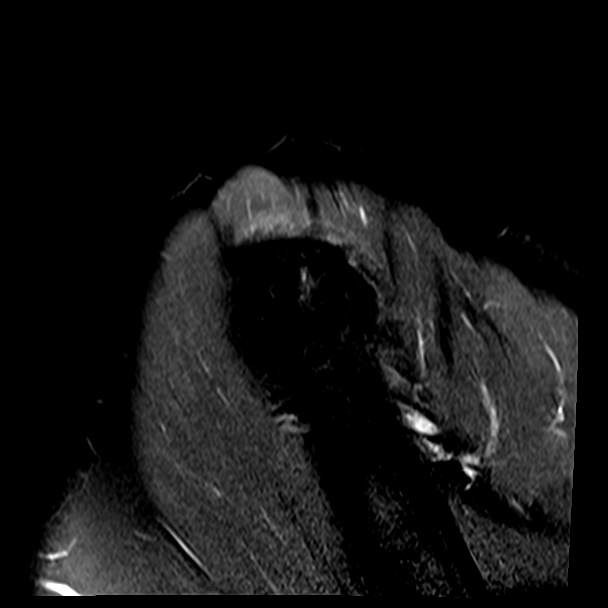
[im 8/22]
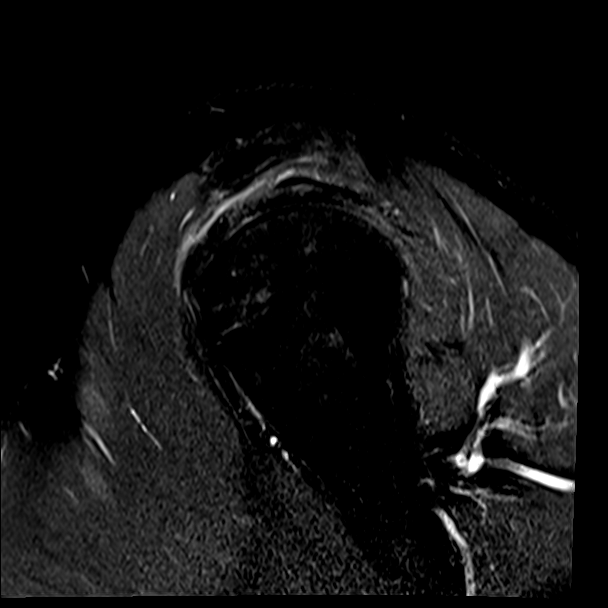
[im 11/22]
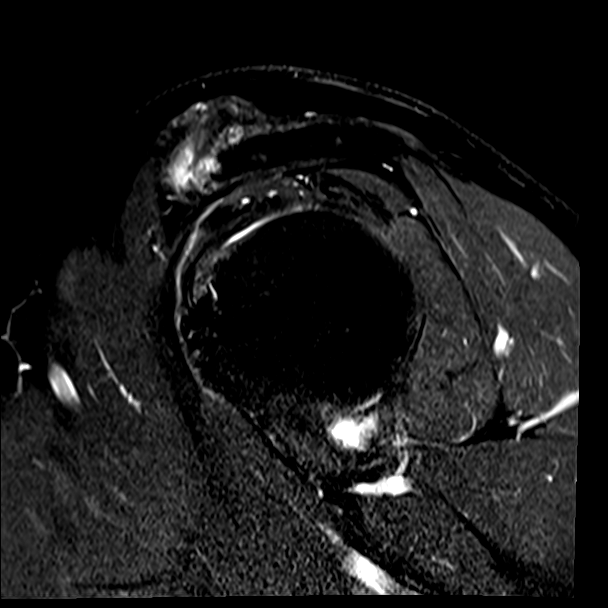
[im 15/22]
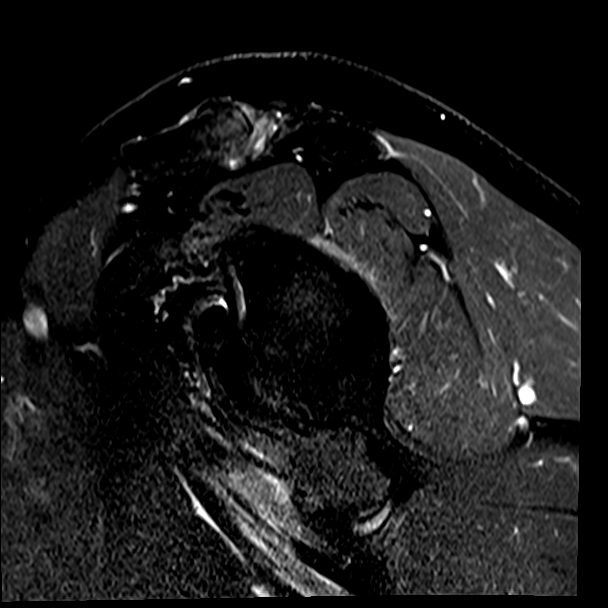
[im 18/22]
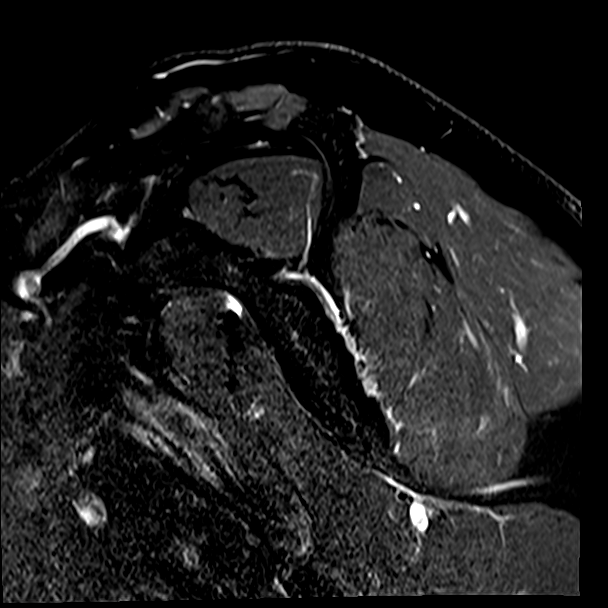
[im 22/22]
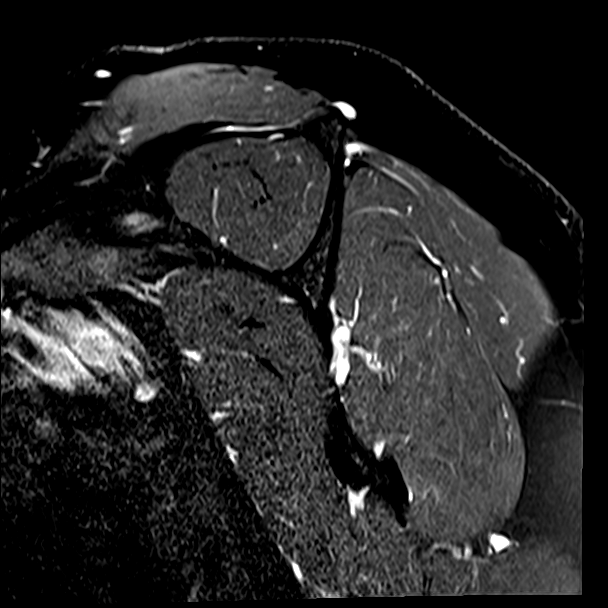

[Series 9: T1 · oblique · right · 4.0mm · 0.36mm/px · 1 of 22 slices shown]
[im 1/22]
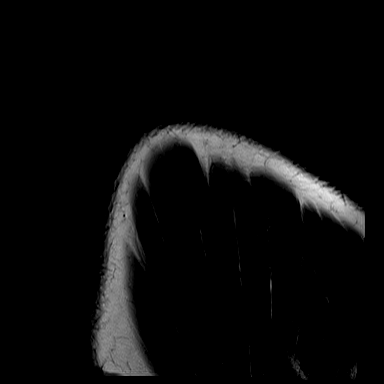

[32 of 40 positions shown; findings below may reference images not displayed]

FINDINGS: Rotator cuff: There is mildly increased T2 signal in the rotator
cuff tendons consistent with tendinopathy. No tear.

Muscles:  Normal without atrophy or focal lesion.

Biceps long head: Intact. There is some tendinopathy of the
intra-articular segment.

Acromioclavicular Joint: Moderate to moderately severe
osteoarthritis. Type 2 acromion. No subacromial/subdeltoid bursal
fluid.

Glenohumeral Joint: Minimal degenerative change. There is thickening
and intermediate increased T2 signal in the inferior glenohumeral
ligament compatible with adhesive capsulitis.

Labrum: The superior labrum is degenerated and frayed but no tear is
seen.

Bones:  No fracture or focal lesion.

Other: None.
IMPRESSION: Mild appearing rotator cuff and intra-articular long head of biceps
tendinopathy without tear.

Moderate to moderately severe acromioclavicular osteoarthritis.

Findings compatible with adhesive capsulitis.

## 2018-11-20 MED ORDER — HYDROCHLOROTHIAZIDE 12.5 MG TABLET
ORAL_TABLET | Freq: Every day | ORAL | 11 refills | 30.00000 days | Status: CP
Start: 2018-11-20 — End: 2019-11-20

## 2018-11-23 ENCOUNTER — Other Ambulatory Visit
Admission: RE | Admit: 2018-11-23 | Discharge: 2018-11-23 | Disposition: A | Discharge status: Home / Self Care | Payer: Federal, State, Local not specified - PPO | Source: Ambulatory Visit | Attending: Orthopedic Surgery | Admitting: Orthopedic Surgery

## 2018-11-23 ENCOUNTER — Other Ambulatory Visit: Payer: Self-pay

## 2018-11-23 DIAGNOSIS — Z1159 Encounter for screening for other viral diseases: Secondary | ICD-10-CM | POA: Diagnosis not present

## 2018-11-24 LAB — SARS CORONAVIRUS 2 (TAT 6-24 HRS): SARS Coronavirus 2: NEGATIVE

## 2018-11-27 ENCOUNTER — Ambulatory Visit: Payer: Federal, State, Local not specified - PPO | Admitting: Anesthesiology

## 2018-11-27 ENCOUNTER — Encounter
Admission: RE | Disposition: A | Discharge status: Home / Self Care | Payer: Self-pay | Source: Home / Self Care | Attending: Orthopedic Surgery

## 2018-11-27 ENCOUNTER — Ambulatory Visit
Admission: RE | Admit: 2018-11-27 | Discharge: 2018-11-27 | Disposition: A | Discharge status: Home / Self Care | Payer: Federal, State, Local not specified - PPO | Attending: Orthopedic Surgery | Admitting: Orthopedic Surgery

## 2018-11-27 DIAGNOSIS — G4730 Sleep apnea, unspecified: Secondary | ICD-10-CM | POA: Insufficient documentation

## 2018-11-27 DIAGNOSIS — M1990 Unspecified osteoarthritis, unspecified site: Secondary | ICD-10-CM | POA: Diagnosis not present

## 2018-11-27 DIAGNOSIS — M7541 Impingement syndrome of right shoulder: Secondary | ICD-10-CM | POA: Insufficient documentation

## 2018-11-27 DIAGNOSIS — F329 Major depressive disorder, single episode, unspecified: Secondary | ICD-10-CM | POA: Diagnosis not present

## 2018-11-27 DIAGNOSIS — I10 Essential (primary) hypertension: Secondary | ICD-10-CM | POA: Diagnosis not present

## 2018-11-27 DIAGNOSIS — Z7982 Long term (current) use of aspirin: Secondary | ICD-10-CM | POA: Insufficient documentation

## 2018-11-27 DIAGNOSIS — Z8546 Personal history of malignant neoplasm of prostate: Secondary | ICD-10-CM | POA: Insufficient documentation

## 2018-11-27 DIAGNOSIS — M19011 Primary osteoarthritis, right shoulder: Secondary | ICD-10-CM | POA: Diagnosis not present

## 2018-11-27 DIAGNOSIS — J45909 Unspecified asthma, uncomplicated: Secondary | ICD-10-CM | POA: Diagnosis not present

## 2018-11-27 DIAGNOSIS — Z79899 Other long term (current) drug therapy: Secondary | ICD-10-CM | POA: Insufficient documentation

## 2018-11-27 DIAGNOSIS — Z87891 Personal history of nicotine dependence: Secondary | ICD-10-CM | POA: Diagnosis not present

## 2018-11-27 DIAGNOSIS — M7521 Bicipital tendinitis, right shoulder: Secondary | ICD-10-CM | POA: Diagnosis not present

## 2018-11-27 SURGERY — SHOULDER ARTHROSCOPY WITH SUBACROMIAL DECOMPRESSION AND DISTAL CLAVICLE EXCISION
Anesthesia: General | Site: Shoulder | Laterality: Right

## 2018-11-27 MED ORDER — OXYCODONE HCL 5 MG PO TABS: 5.0000 mg | ORAL_TABLET | Freq: Once | ORAL | Status: DC | PRN

## 2018-11-27 MED ORDER — OXYCODONE HCL 5 MG PO TABS: 5.0000 mg | ORAL_TABLET | ORAL | 0 refills | Status: DC | PRN

## 2018-11-27 MED ORDER — BUPIVACAINE HCL (PF) 0.5 % IJ SOLN
INTRAMUSCULAR | Status: DC | PRN
  Administered 2018-11-27: 20 mL via PERINEURAL

## 2018-11-27 MED ORDER — FENTANYL CITRATE (PF) 100 MCG/2ML IJ SOLN
INTRAMUSCULAR | Status: DC | PRN
  Administered 2018-11-27: 100 ug via INTRAVENOUS

## 2018-11-27 MED ORDER — ACETAMINOPHEN 160 MG/5ML PO SOLN: 325.0000 mg | ORAL | Status: DC | PRN

## 2018-11-27 MED ORDER — IBUPROFEN 800 MG PO TABS: 800.0000 mg | ORAL_TABLET | Freq: Three times a day (TID) | ORAL | 0 refills | Status: AC

## 2018-11-27 MED ORDER — ACETAMINOPHEN 500 MG PO TABS: 1000.0000 mg | ORAL_TABLET | Freq: Three times a day (TID) | ORAL | 2 refills | Status: DC

## 2018-11-27 MED ORDER — LIDOCAINE HCL (CARDIAC) PF 100 MG/5ML IV SOSY
PREFILLED_SYRINGE | INTRAVENOUS | Status: DC | PRN
  Administered 2018-11-27: 20 mg via INTRATRACHEAL

## 2018-11-27 MED ORDER — CLINDAMYCIN PHOSPHATE 900 MG/50ML IV SOLN
900.0000 mg | Freq: Once | INTRAVENOUS | Status: AC
  Administered 2018-11-27: 900 mg via INTRAVENOUS

## 2018-11-27 MED ORDER — BUPIVACAINE LIPOSOME 1.3 % IJ SUSP
INTRAMUSCULAR | Status: DC | PRN
  Administered 2018-11-27: 20 mL via PERINEURAL

## 2018-11-27 MED ORDER — LACTATED RINGERS IV SOLN
INTRAVENOUS | Status: DC | PRN
  Administered 2018-11-27: 4 mL

## 2018-11-27 MED ORDER — ONDANSETRON 4 MG PO TBDP: 4.0000 mg | ORAL_TABLET | Freq: Three times a day (TID) | ORAL | 0 refills | Status: DC | PRN

## 2018-11-27 MED ORDER — OXYCODONE HCL 5 MG/5ML PO SOLN: 5.0000 mg | Freq: Once | ORAL | Status: DC | PRN

## 2018-11-27 MED ORDER — ONDANSETRON HCL 4 MG/2ML IJ SOLN: 4.0000 mg | Freq: Once | INTRAMUSCULAR | Status: DC | PRN

## 2018-11-27 MED ORDER — DEXAMETHASONE SODIUM PHOSPHATE 4 MG/ML IJ SOLN
INTRAMUSCULAR | Status: DC | PRN
  Administered 2018-11-27: 4 mg via INTRAVENOUS

## 2018-11-27 MED ORDER — LACTATED RINGERS IV SOLN
INTRAVENOUS | Status: DC
  Administered 2018-11-27 (×2): via INTRAVENOUS

## 2018-11-27 MED ORDER — PHENYLEPHRINE HCL (PRESSORS) 10 MG/ML IV SOLN
INTRAVENOUS | Status: DC | PRN
  Administered 2018-11-27 (×8): 50 ug via INTRAVENOUS

## 2018-11-27 MED ORDER — MIDAZOLAM HCL 5 MG/5ML IJ SOLN
INTRAMUSCULAR | Status: DC | PRN
  Administered 2018-11-27: 2 mg via INTRAVENOUS

## 2018-11-27 MED ORDER — PROPOFOL 10 MG/ML IV BOLUS
INTRAVENOUS | Status: DC | PRN
  Administered 2018-11-27: 30 mg via INTRAVENOUS
  Administered 2018-11-27: 170 mg via INTRAVENOUS

## 2018-11-27 MED ORDER — ACETAMINOPHEN 325 MG PO TABS: 325.0000 mg | ORAL_TABLET | ORAL | Status: DC | PRN

## 2018-11-27 MED ORDER — ONDANSETRON HCL 4 MG/2ML IJ SOLN
INTRAMUSCULAR | Status: DC | PRN
  Administered 2018-11-27: 4 mg via INTRAVENOUS

## 2018-11-27 MED ORDER — ASPIRIN EC 325 MG PO TBEC: 325.0000 mg | DELAYED_RELEASE_TABLET | Freq: Every day | ORAL | 0 refills | Status: AC

## 2018-11-27 MED ORDER — GLYCOPYRROLATE 0.2 MG/ML IJ SOLN
INTRAMUSCULAR | Status: DC | PRN
  Administered 2018-11-27 (×2): 0.1 mg via INTRAVENOUS

## 2018-11-27 MED ORDER — EPHEDRINE SULFATE 50 MG/ML IJ SOLN
INTRAMUSCULAR | Status: DC | PRN
  Administered 2018-11-27 (×2): 10 mg via INTRAVENOUS
  Administered 2018-11-27 (×3): 5 mg via INTRAVENOUS
  Administered 2018-11-27: 10 mg via INTRAVENOUS

## 2018-11-27 MED ORDER — ASPIRIN EC 325 MG PO TBEC: 325.0000 mg | DELAYED_RELEASE_TABLET | Freq: Every day | ORAL | 0 refills | Status: DC

## 2018-11-27 MED ORDER — FENTANYL CITRATE (PF) 100 MCG/2ML IJ SOLN: 25.0000 ug | INTRAMUSCULAR | Status: DC | PRN

## 2018-11-27 SURGICAL SUPPLY — 51 items
ADAPTER IRRIG TUBE 2 SPIKE SOL (ADAPTER) ×4 IMPLANT
ANCHOR SUT BIOCOMP LK 2.9X12.5 (Anchor) ×1 IMPLANT
BUR RADIUS 4.0X18.5 (BURR) ×1 IMPLANT
BUR RADIUS 5.5 (BURR) ×2 IMPLANT
CANNULA PARTIAL THREAD 2X7 (CANNULA) ×1 IMPLANT
CHLORAPREP W/TINT 26 (MISCELLANEOUS) ×1 IMPLANT
COOLER POLAR GLACIER W/PUMP (MISCELLANEOUS) ×2 IMPLANT
COVER LIGHT HANDLE UNIVERSAL (MISCELLANEOUS) ×4 IMPLANT
DEVICE SUCT BLK HOLE OR FLOOR (MISCELLANEOUS) ×1 IMPLANT
DRAPE IMP U-DRAPE 54X76 (DRAPES) ×4 IMPLANT
DRAPE INCISE IOBAN 66X45 STRL (DRAPES) ×2 IMPLANT
DRAPE SHEET LG 3/4 BI-LAMINATE (DRAPES) ×2 IMPLANT
DRAPE U-SHAPE 48X52 POLY STRL (PACKS) ×4 IMPLANT
DRSG TEGADERM 4X4.75 (GAUZE/BANDAGES/DRESSINGS) ×6 IMPLANT
ELECT REM PT RETURN 9FT ADLT (ELECTROSURGICAL) ×2
ELECTRODE REM PT RTRN 9FT ADLT (ELECTROSURGICAL) ×1 IMPLANT
GAUZE SPONGE 4X4 12PLY STRL (GAUZE/BANDAGES/DRESSINGS) ×2 IMPLANT
GAUZE XEROFORM 1X8 LF (GAUZE/BANDAGES/DRESSINGS) ×1 IMPLANT
GLOVE BIO SURGEON STRL SZ7.5 (GLOVE) ×5 IMPLANT
GLOVE BIOGEL PI IND STRL 8 (GLOVE) ×1 IMPLANT
GLOVE BIOGEL PI INDICATOR 8 (GLOVE) ×2
GOWN STRL REIN 2XL XLG LVL4 (GOWN DISPOSABLE) ×2 IMPLANT
GOWN STRL REUS W/ TWL LRG LVL3 (GOWN DISPOSABLE) ×1 IMPLANT
GOWN STRL REUS W/TWL LRG LVL3 (GOWN DISPOSABLE) ×1
IV LACTATED RINGER IRRG 3000ML (IV SOLUTION) ×20
IV LR IRRIG 3000ML ARTHROMATIC (IV SOLUTION) ×8 IMPLANT
KIT DISPOSABLE PUSHLOCK 2.9MM (KITS) ×1 IMPLANT
KIT STABILIZATION SHOULDER (MISCELLANEOUS) ×2 IMPLANT
KIT TURNOVER KIT A (KITS) ×2 IMPLANT
MANIFOLD NEPTUNE II (INSTRUMENTS) ×2 IMPLANT
MASK FACE SPIDER DISP (MASK) ×2 IMPLANT
MAT GRAY ABSORB FLUID 28X50 (MISCELLANEOUS) ×4 IMPLANT
NDL SAFETY ECLIPSE 18X1.5 (NEEDLE) ×1 IMPLANT
NDL SCORPION MULTI FIRE (NEEDLE) IMPLANT
NEEDLE HYPO 18GX1.5 SHARP (NEEDLE) ×1
NEEDLE SCORPION MULTI FIRE (NEEDLE) ×2 IMPLANT
PACK ARTHROSCOPY SHOULDER (MISCELLANEOUS) ×2 IMPLANT
PAD WRAPON POLAR SHDR UNIV (MISCELLANEOUS) ×1 IMPLANT
PENCIL SMOKE EVACUATOR (MISCELLANEOUS) ×2 IMPLANT
SET TUBE SUCT SHAVER OUTFL 24K (TUBING) ×2 IMPLANT
SET TUBE TIP INTRA-ARTICULAR (MISCELLANEOUS) ×2 IMPLANT
SLING ARM LRG DEEP (SOFTGOODS) ×1 IMPLANT
SPONGE GAUZE 2X2 8PLY STRL LF (GAUZE/BANDAGES/DRESSINGS) ×6 IMPLANT
SUT ETHILON 3-0 FS-10 30 BLK (SUTURE) ×2
SUTURE EHLN 3-0 FS-10 30 BLK (SUTURE) ×1 IMPLANT
SUTURE TAPE FIBERLINK 1.3 LOOP (SUTURE) IMPLANT
SUTURETAPE FIBERLINK 1.3 LOOP (SUTURE) ×2
SYR 10ML LL (SYRINGE) ×2 IMPLANT
TUBING ARTHRO INFLOW-ONLY STRL (TUBING) ×2 IMPLANT
WAND WEREWOLF FLOW 90D (MISCELLANEOUS) ×2 IMPLANT
WRAPON POLAR PAD SHDR UNIV (MISCELLANEOUS) ×2

## 2018-11-27 NOTE — Anesthesia Postprocedure Evaluation (Signed)
Anesthesia Post Note  Patient: Trajan Grove  Procedure(s) Performed: SHOULDER ARTHROSCOPY BICEPS TENODOSIS SUBACROMIAL DECOMPRESSION AND DISTAL CLAVICLE EXCISION RIGHT (Right Shoulder)  Patient location during evaluation: PACU Anesthesia Type: General Level of consciousness: awake and alert and oriented Pain management: satisfactory to patient Vital Signs Assessment: post-procedure vital signs reviewed and stable Respiratory status: spontaneous breathing, nonlabored ventilation and respiratory function stable Cardiovascular status: blood pressure returned to baseline and stable Postop Assessment: Adequate PO intake and No signs of nausea or vomiting Anesthetic complications: no    Raliegh Ip

## 2018-11-27 NOTE — Discharge Instructions (Signed)
Post-Op Instructions - Arthroscopic Biceps Tenodesis, Subacromial Decompression, and/or Distal Clavicle Excision  1. Bracing: You will wear a shoulder immobilizer or sling for no longer than 2 weeks. You can self wean from sling when it is comfortable to do so.   2. Driving: No driving for at least 1-2 weeks post-op.   3. Activity: Progress to motion as tolerated, moving from passive to active-assisted to active motion. Goal for motion by 4 weeks is 140 degrees forward flexion, 40 ER, IR behind back. Avoid abduction and ER/IR until after 4 weeks.  Return to daily activities normally takes 2-3 months on average.  4. Physical Therapy: Begins 3-4 days after surgery  5. Medications:  - You will be provided a prescription for narcotic pain medicine. After surgery, take 1-2 narcotic tablets every 4 hours if needed for severe pain.  - A prescription for anti-nausea medication will be provided in case the narcotic medicine causes nausea - take 1 tablet every 6 hours only if nauseated.   - Take ASA 325mg /day x 2 weeks to help prevent DVTs/PEs (blood clots).  - Take ibuprofen 800mg  three times/day with food. Stop if there is any GI irritation.   If you are taking prescription medication for anxiety, depression, insomnia, muscle spasm, chronic pain, or for attention deficit disorder, you are advised that you are at a higher risk of adverse effects with use of narcotics post-op, including narcotic addiction/dependence, depressed breathing, death. If you use non-prescribed substances: alcohol, marijuana, cocaine, heroin, methamphetamines, etc., you are at a higher risk of adverse effects with use of narcotics post-op, including narcotic addiction/dependence, depressed breathing, death. You are advised that taking > 50 morphine milligram equivalents (MME) of narcotic pain medication per day results in twice the risk of overdose or death. For your prescription provided: hydrocodone 5 mg - taking more than 8  tablets per day would result in > 50 morphine milligram equivalents (MME) of narcotic pain medication. Be advised that we will prescribe narcotics short-term, for acute post-operative pain only - 3 weeks for major operations such as shoulder repair/reconstruction surgeries.    6. Post-Op Appointment:  Your first post-op appointment will be 10-14 days post-op.  7. Work or School: For most, but not all procedures, we advise staying out of work or school for at least 1 to 2 weeks in order to recover from the stress of surgery and to allow time for healing.   If you need a work or school note this can be provided.   8. Smoking: If you are a smoker, you need to refrain from smoking in the postoperative period. The nicotine in cigarettes will inhibit healing of your shoulder repair and decrease the chance of successful repair. Similarly, nicotine containing products (gum, patches) should be avoided.    Information for Discharge Teaching: EXPAREL (bupivacaine liposome injectable suspension)   Your surgeon or anesthesiologist gave you EXPAREL(bupivacaine) to help control your pain after surgery.   EXPAREL is a local anesthetic that provides pain relief by numbing the tissue around the surgical site.  EXPAREL is designed to release pain medication over time and can control pain for up to 72 hours.  Depending on how you respond to EXPAREL, you may require less pain medication during your recovery.  Possible side effects:  Temporary loss of sensation or ability to move in the area where bupivacaine was injected.  Nausea, vomiting, constipation  Rarely, numbness and tingling in your mouth or lips, lightheadedness, or anxiety may occur.  Call your doctor  right away if you think you may be experiencing any of these sensations, or if you have other questions regarding possible side effects.  Follow all other discharge instructions given to you by your surgeon or nurse. Eat a healthy diet and  drink plenty of water or other fluids.  If you return to the hospital for any reason within 96 hours following the administration of EXPAREL, it is important for health care providers to know that you have received this anesthetic. A teal colored band has been placed on your arm with the date, time and amount of EXPAREL you have received in order to alert and inform your health care providers. Please leave this armband in place for the full 96 hours following administration, and then you may remove the band.   General Anesthesia, Adult, Care After This sheet gives you information about how to care for yourself after your procedure. Your health care provider may also give you more specific instructions. If you have problems or questions, contact your health care provider. What can I expect after the procedure? After the procedure, the following side effects are common:  Pain or discomfort at the IV site.  Nausea.  Vomiting.  Sore throat.  Trouble concentrating.  Feeling cold or chills.  Weak or tired.  Sleepiness and fatigue.  Soreness and body aches. These side effects can affect parts of the body that were not involved in surgery. Follow these instructions at home:  For at least 24 hours after the procedure:  Have a responsible adult stay with you. It is important to have someone help care for you until you are awake and alert.  Rest as needed.  Do not: ? Participate in activities in which you could fall or become injured. ? Drive. ? Use heavy machinery. ? Drink alcohol. ? Take sleeping pills or medicines that cause drowsiness. ? Make important decisions or sign legal documents. ? Take care of children on your own. Eating and drinking  Follow any instructions from your health care provider about eating or drinking restrictions.  When you feel hungry, start by eating small amounts of foods that are soft and easy to digest (bland), such as toast. Gradually return to your  regular diet.  Drink enough fluid to keep your urine pale yellow.  If you vomit, rehydrate by drinking water, juice, or clear broth. General instructions  If you have sleep apnea, surgery and certain medicines can increase your risk for breathing problems. Follow instructions from your health care provider about wearing your sleep device: ? Anytime you are sleeping, including during daytime naps. ? While taking prescription pain medicines, sleeping medicines, or medicines that make you drowsy.  Return to your normal activities as told by your health care provider. Ask your health care provider what activities are safe for you.  Take over-the-counter and prescription medicines only as told by your health care provider.  If you smoke, do not smoke without supervision.  Keep all follow-up visits as told by your health care provider. This is important. Contact a health care provider if:  You have nausea or vomiting that does not get better with medicine.  You cannot eat or drink without vomiting.  You have pain that does not get better with medicine.  You are unable to pass urine.  You develop a skin rash.  You have a fever.  You have redness around your IV site that gets worse. Get help right away if:  You have difficulty breathing.  You have  chest pain.  You have blood in your urine or stool, or you vomit blood. Summary  After the procedure, it is common to have a sore throat or nausea. It is also common to feel tired.  Have a responsible adult stay with you for the first 24 hours after general anesthesia. It is important to have someone help care for you until you are awake and alert.  When you feel hungry, start by eating small amounts of foods that are soft and easy to digest (bland), such as toast. Gradually return to your regular diet.  Drink enough fluid to keep your urine pale yellow.  Return to your normal activities as told by your health care provider. Ask  your health care provider what activities are safe for you. This information is not intended to replace advice given to you by your health care provider. Make sure you discuss any questions you have with your health care provider. Document Released: 07/25/2000 Document Revised: 04/21/2017 Document Reviewed: 12/02/2016 Elsevier Patient Education  2020 [employer].

## 2018-11-27 NOTE — Transfer of Care (Signed)
Immediate Anesthesia Transfer of Care Note  Patient: Brendan Larson  Procedure(s) Performed: SHOULDER ARTHROSCOPY BICEPS TENODOSIS SUBACROMIAL DECOMPRESSION AND DISTAL CLAVICLE EXCISION RIGHT (Right Shoulder)  Patient Location: PACU  Anesthesia Type: General LMA  Level of Consciousness: awake, alert  and patient cooperative  Airway and Oxygen Therapy: Patient Spontanous Breathing and Patient connected to supplemental oxygen  Post-op Assessment: Post-op Vital signs reviewed, Patient's Cardiovascular Status Stable, Respiratory Function Stable, Patent Airway and No signs of Nausea or vomiting  Post-op Vital Signs: Reviewed and stable  Complications: No apparent anesthesia complications

## 2018-11-27 NOTE — Anesthesia Procedure Notes (Signed)
Anesthesia Regional Block: Interscalene brachial plexus block   Pre-Anesthetic Checklist: ,, timeout performed, Correct Patient, Correct Site, Correct Laterality, Correct Procedure, Correct Position, site marked, Risks and benefits discussed,  Surgical consent,  Pre-op evaluation,  At surgeon's request and post-op pain management  Laterality: Right  Prep: chloraprep       Needles:  Injection technique: Single-shot  Needle Type: Stimiplex     Needle Length: 10cm  Needle Gauge: 21     Additional Needles:   Procedures:,,,, ultrasound used (permanent image in chart),,,,  Narrative:  Start time: 11/27/2018 11:06 AM End time: 11/27/2018 11:17 AM Injection made incrementally with aspirations every 5 mL.  Performed by: Personally  Anesthesiologist: Ronelle Nigh, MD  Additional Notes: Functioning IV was confirmed and monitors applied. Ultrasound guidance: relevant anatomy identified, needle position confirmed, local anesthetic spread visualized around nerve(s)., vascular puncture avoided.  Image printed for medical record.  Negative aspiration and no paresthesias; incremental administration of local anesthetic. The patient tolerated the procedure well. Vitals signes recorded in RN notes.

## 2018-11-27 NOTE — Op Note (Signed)
SURGERY DATE: 11/27/2018  PRE-OP DIAGNOSIS:  1. Right acromioclavicular joint osteoarthritis 2. Right biceps tendinopathy 3. Right subacromial impingement  POST-OP DIAGNOSIS: 1. Right acromioclavicular joint osteoarthritis 2. Right biceps tendinopathy 3. Right subacromial impingement   PROCEDURES:  1. Right arthroscopic biceps tenodesis 2. Right arthroscopic distal clavicle excision 3. Right extensive debridement of shoulder (glenohumeral and subacromial spaces) 4. Right arthroscopic subacromial decompression  SURGEON: Cato Mulligan, MD  ANESTHESIA: Gen with postoperative interscalene block  ESTIMATED BLOOD LOSS: 10cc  DRAINS:  none  TOTAL IV FLUIDS: per anesthesia   SPECIMENS: none  IMPLANTS:  Arthrex - 2.59mm PushLock anchor  OPERATIVE FINDINGS:  Examination under anesthesia: A careful examination under anesthesia was performed.  Passive range of motion was: FF: 160; ER at side: 50; ER in abduction: 90; IR in abduction: 45.  Anterior load shift: NT.  Posterior load shift: NT.  Sulcus in neutral: NT.  Sulcus in ER: NT.    Intra-operative findings: A thorough arthroscopic examination of the shoulder was performed.  The findings are: 1. Biceps tendon: significant tendinopathy at the biceps anchor with yellowish degenerative appearing tendon 2. Superior labrum: normal 3. Posterior labrum and capsule: normal 4. Inferior capsule and inferior recess: normal 5. Glenoid cartilage surface: normal 6. Supraspinatus attachment: normal 7. Posterior rotator cuff attachment: normal 8. Humeral head articular cartilage: normal 9. Rotator interval: significant synovitis 10: Subscapularis tendon: attachment intact 11. Anterior labrum: degenerative and inflamed 12. IGHL: normal  OPERATIVE REPORT:   Indications for procedure: Brendan Larson is a 63 y.o. male with over 1 year of right shoulder pain.  MRI and clinical exam are concerning for biceps tendinopathy, acromioclavicular joint  arthritis, and subacromial impingement/bursitis.  The patient has failed nonoperative management and is still significantly symptomatic. After discussion of risks, benefits, and alternatives to surgery, the patient elected to proceed with surgical intervention.  Procedure in detail: I identified the patient in the pre-operative holding area.  I marked the operative shoulder with my initials. I reviewed the risks and benefits of the proposed surgical intervention, and the patient (and/or patient's guardian) wished to proceed.  Anesthesia was then performed with an interscalene block with Exparel.  The patient was transferred to the operative suite and placed in the beach chair position.    SCDs were placed on the lower extremities. Appropriate IV antibiotics were administered prior to incision. The operative upper extremity was then prepped and draped in standard fashion. A time out was performed confirming the correct extremity, correct patient, and correct procedure.   I then created a standard posterior portal with an 11 blade. The glenohumeral joint was easily entered with a blunt trocar and the arthroscope introduced. The findings of diagnostic arthroscopy are described above. I debrided degenerative tissue including the synovitic tissue about the rotator interval, the anterior labrum, and the biceps anchor. I then coagulated the inflamed synovium to obtain hemostasis and reduce the risk of post-operative swelling using an Arthrocare radiofrequency device.  I then turned my attention to the arthroscopic biceps tenodesis.  I used the loop n tack technique to pass a fiber tape through the biceps in a locked fashion adjacent to the biceps anchor.  The biceps tendon was then cut at its stump.  A hole for a 2.9 mm Arthrex PushLock was drilled in the bicipital groove just superior to the subscapularis tendon insertion.  The fiber tape was loaded onto the push lock anchor and impacted into place into the  previously drilled hole in the bicipital groove.  This appropriately secured the biceps into the bicipital groove and took it off of tension.  The remnant of the stump was then debrided with an ArthroCare wand.  Next, the arthroscope was then introduced into the subacromial space. A direct lateral portal was created with an 11-blade after spinal needle localization. An extensive subacromial bursectomy was performed using a combination of the shaver and Arthrocare wand. The entire acromial undersurface was exposed and the CA ligament was subperiosteally elevated to expose the anterior acromial hook. A 5.24mm barrel burr was used to create a flat anterior and lateral aspect of the acromion, converting it from a Type 2 to a Type 1 acromion. Care was made to keep the deltoid fascia intact.  I then turned my attention to the arthroscopic distal clavicle excision. I identified the acromioclavicular joint. Surrounding bursal tissue was debrided and the edges of the joint were identified. I used the 5.91mm barrel burr to remove the distal clavicle parallel to the edge of the acromion. Appropriate resection of ~27mm was confirmed using a probe to measure the space between the distal clavicle and acromion.  Hemostasis was achieved with an Arthrocare wand.   Fluid was evacuated from the shoulder, and the portals were closed with 3-0 Nylon. Xeroform was applied to the portals. A sterile dressing was applied, followed by a Polar Care sleeve and a sling. The patient was awakened from anesthesia without difficulty and was transferred to the PACU in stable condition.     COMPLICATIONS: none  DISPOSITION: plan for discharge home after recovery in PACU   POSTOPERATIVE PLAN: Sling for 1-2 weeks for comfort. PT to begin 3-4 days after surgery.  Use arthroscopic biceps tenodesis with distal clavicle excision and subacromial decompression rehab protocol.  Follow-up with me as an outpatient in approximately 2 weeks.  ASA for  DVT prophylaxis x2 weeks.

## 2018-11-27 NOTE — Anesthesia Preprocedure Evaluation (Signed)
Anesthesia Evaluation  Patient identified by MRN, date of birth, ID band Patient awake    Reviewed: Allergy & Precautions, H&P , NPO status , Patient's Chart, lab work & pertinent test results  Airway Mallampati: II  TM Distance: >3 FB Neck ROM: full    Dental no notable dental hx.    Pulmonary asthma , sleep apnea , Current Smoker,    Pulmonary exam normal breath sounds clear to auscultation       Cardiovascular hypertension, Normal cardiovascular exam+ Valvular Problems/Murmurs MVP  Rhythm:regular Rate:Normal     Neuro/Psych PSYCHIATRIC DISORDERS    GI/Hepatic GERD  ,  Endo/Other    Renal/GU      Musculoskeletal   Abdominal   Peds  Hematology   Anesthesia Other Findings   Reproductive/Obstetrics                             Anesthesia Physical  Anesthesia Plan  ASA: II  Anesthesia Plan: General LMA   Post-op Pain Management:  Regional for Post-op pain   Induction:   PONV Risk Score and Plan: 2 and Ondansetron, Midazolam and Treatment may vary due to age or medical condition  Airway Management Planned:   Additional Equipment:   Intra-op Plan:   Post-operative Plan:   Informed Consent: I have reviewed the patients History and Physical, chart, labs and discussed the procedure including the risks, benefits and alternatives for the proposed anesthesia with the patient or authorized representative who has indicated his/her understanding and acceptance.       Plan Discussed with: CRNA  Anesthesia Plan Comments:         Anesthesia Quick Evaluation

## 2018-11-27 NOTE — Progress Notes (Signed)
Assisted Mike Stella ANMD with right, ultrasound guided, supraclavicular block. Side rails up, monitors on throughout procedure. See vital signs in flow sheet. Tolerated Procedure well.  

## 2018-11-27 NOTE — H&P (Signed)
Paper H&P to be scanned into permanent record. H&P reviewed. No significant changes noted.  

## 2018-11-27 NOTE — Anesthesia Procedure Notes (Signed)
Procedure Name: LMA Insertion Date/Time: 11/27/2018 12:46 PM Performed by: Cameron Ali, CRNA Pre-anesthesia Checklist: Patient identified, Emergency Drugs available, Suction available, Timeout performed and Patient being monitored Patient Re-evaluated:Patient Re-evaluated prior to induction Oxygen Delivery Method: Circle system utilized Preoxygenation: Pre-oxygenation with 100% oxygen Induction Type: IV induction LMA: LMA inserted LMA Size: 4.0 Number of attempts: 2 Placement Confirmation: positive ETCO2 and breath sounds checked- equal and bilateral Tube secured with: Tape Dental Injury: Teeth and Oropharynx as per pre-operative assessment  Comments: Unable to pass 5 LMA. 4 LMA placed as charted.

## 2018-12-12 MED ORDER — AMLODIPINE 5 MG TABLET
ORAL_TABLET | 1 refills | 0 days | Status: CP
Start: 2018-12-12 — End: ?

## 2019-01-03 ENCOUNTER — Emergency Department: Payer: Federal, State, Local not specified - PPO

## 2019-01-03 ENCOUNTER — Emergency Department
Admission: EM | Admit: 2019-01-03 | Discharge: 2019-01-03 | Disposition: A | Discharge status: Home / Self Care | Payer: Federal, State, Local not specified - PPO | Attending: Emergency Medicine | Admitting: Emergency Medicine

## 2019-01-03 ENCOUNTER — Encounter: Payer: Self-pay | Admitting: Emergency Medicine

## 2019-01-03 ENCOUNTER — Other Ambulatory Visit: Payer: Self-pay

## 2019-01-03 DIAGNOSIS — C61 Malignant neoplasm of prostate: Secondary | ICD-10-CM | POA: Insufficient documentation

## 2019-01-03 DIAGNOSIS — K5641 Fecal impaction: Secondary | ICD-10-CM | POA: Diagnosis not present

## 2019-01-03 DIAGNOSIS — F17210 Nicotine dependence, cigarettes, uncomplicated: Secondary | ICD-10-CM | POA: Insufficient documentation

## 2019-01-03 DIAGNOSIS — C7951 Secondary malignant neoplasm of bone: Secondary | ICD-10-CM | POA: Insufficient documentation

## 2019-01-03 DIAGNOSIS — J45909 Unspecified asthma, uncomplicated: Secondary | ICD-10-CM | POA: Diagnosis not present

## 2019-01-03 DIAGNOSIS — I10 Essential (primary) hypertension: Secondary | ICD-10-CM | POA: Diagnosis not present

## 2019-01-03 DIAGNOSIS — K625 Hemorrhage of anus and rectum: Secondary | ICD-10-CM | POA: Diagnosis present

## 2019-01-03 LAB — CBC WITH DIFFERENTIAL/PLATELET
Abs Immature Granulocytes: 0.03 10*3/uL (ref 0.00–0.07)
Basophils Absolute: 0 10*3/uL (ref 0.0–0.1)
Basophils Relative: 0 %
Eosinophils Absolute: 0.1 10*3/uL (ref 0.0–0.5)
Eosinophils Relative: 1 %
HCT: 36.3 % — ABNORMAL LOW (ref 39.0–52.0)
Hemoglobin: 12.3 g/dL — ABNORMAL LOW (ref 13.0–17.0)
Immature Granulocytes: 0 %
Lymphocytes Relative: 16 %
Lymphs Abs: 1.1 10*3/uL (ref 0.7–4.0)
MCH: 30.7 pg (ref 26.0–34.0)
MCHC: 33.9 g/dL (ref 30.0–36.0)
MCV: 90.5 fL (ref 80.0–100.0)
Monocytes Absolute: 0.5 10*3/uL (ref 0.1–1.0)
Monocytes Relative: 7 %
Neutro Abs: 5.2 10*3/uL (ref 1.7–7.7)
Neutrophils Relative %: 76 %
Platelets: 189 10*3/uL (ref 150–400)
RBC: 4.01 MIL/uL — ABNORMAL LOW (ref 4.22–5.81)
RDW: 12.6 % (ref 11.5–15.5)
WBC: 6.9 10*3/uL (ref 4.0–10.5)
nRBC: 0 % (ref 0.0–0.2)

## 2019-01-03 LAB — BASIC METABOLIC PANEL
Anion gap: 12 (ref 5–15)
BUN: 20 mg/dL (ref 8–23)
CO2: 27 mmol/L (ref 22–32)
Calcium: 9.4 mg/dL (ref 8.9–10.3)
Chloride: 100 mmol/L (ref 98–111)
Creatinine, Ser: 0.78 mg/dL (ref 0.61–1.24)
GFR calc Af Amer: >60 mL/min (ref >60)
GFR calc non Af Amer: >60 mL/min (ref >60)
Glucose, Bld: 120 mg/dL — ABNORMAL HIGH (ref 70–99)
Potassium: 3.9 mmol/L (ref 3.5–5.1)
Sodium: 139 mmol/L (ref 135–145)

## 2019-01-03 IMAGING — CR DG ABDOMEN 2V
3 series · 3 of 3 positions shown · non-contrast
Comparison: CT scan of the abdomen dated [DATE]

CLINICAL DATA: Abdominal pain. Constipation.

EXAM:
ABDOMEN - 2 VIEW

[abdomen erect]
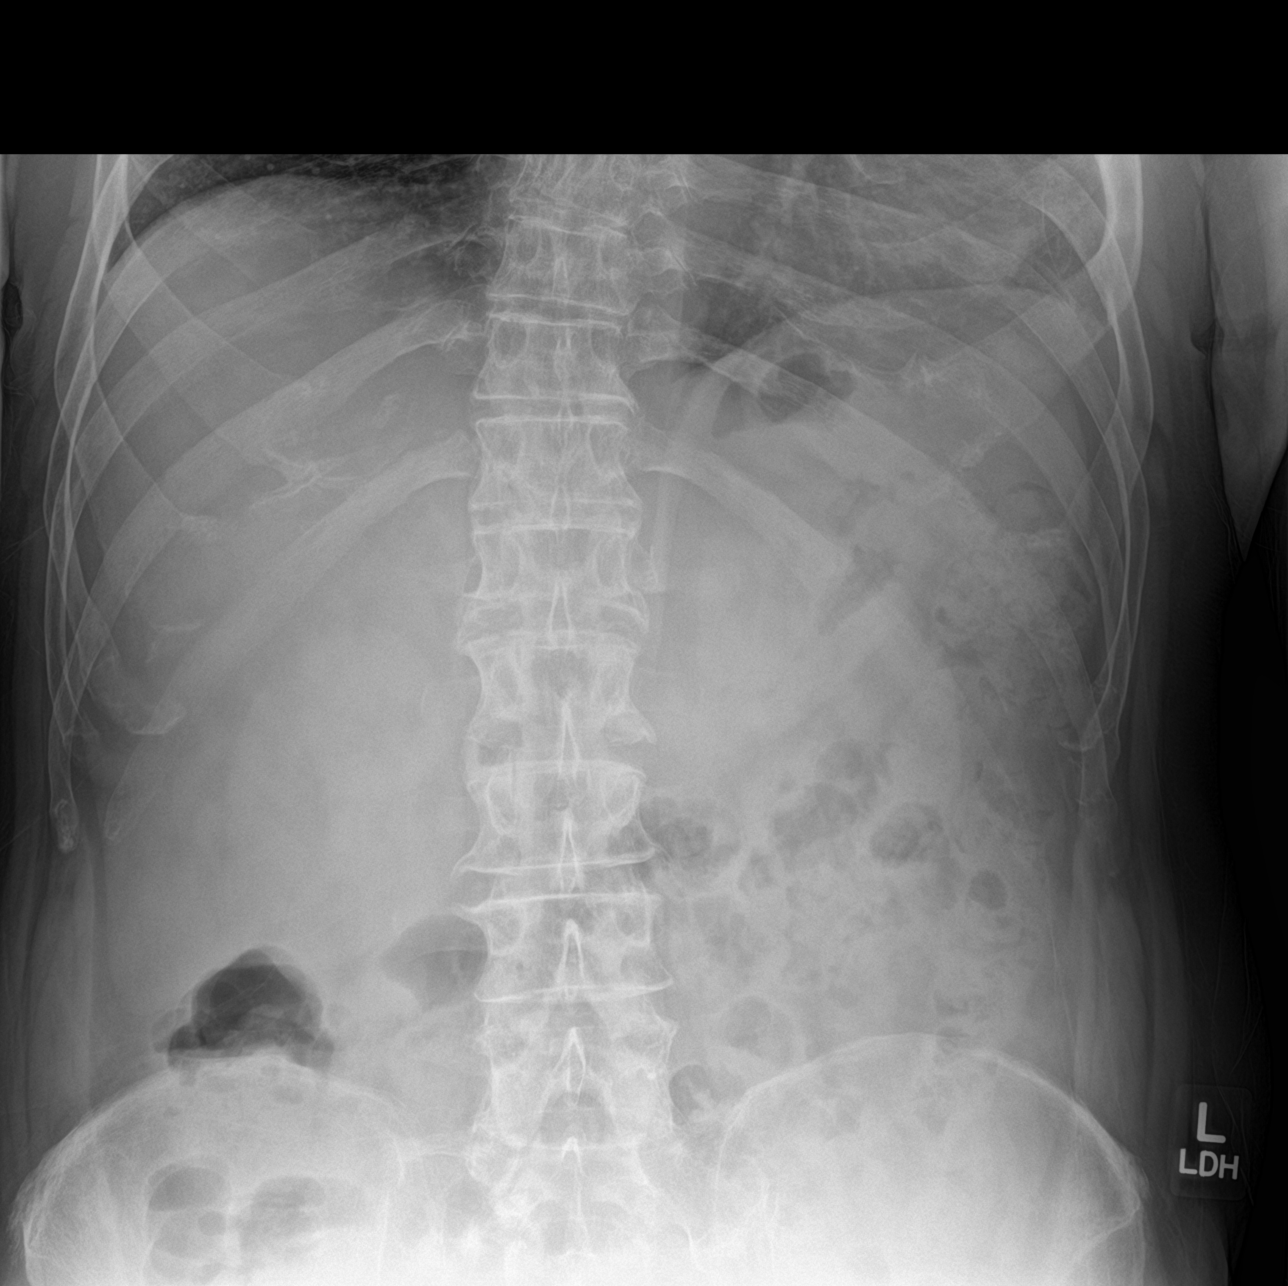

[abdomen supine (1 of 2)]
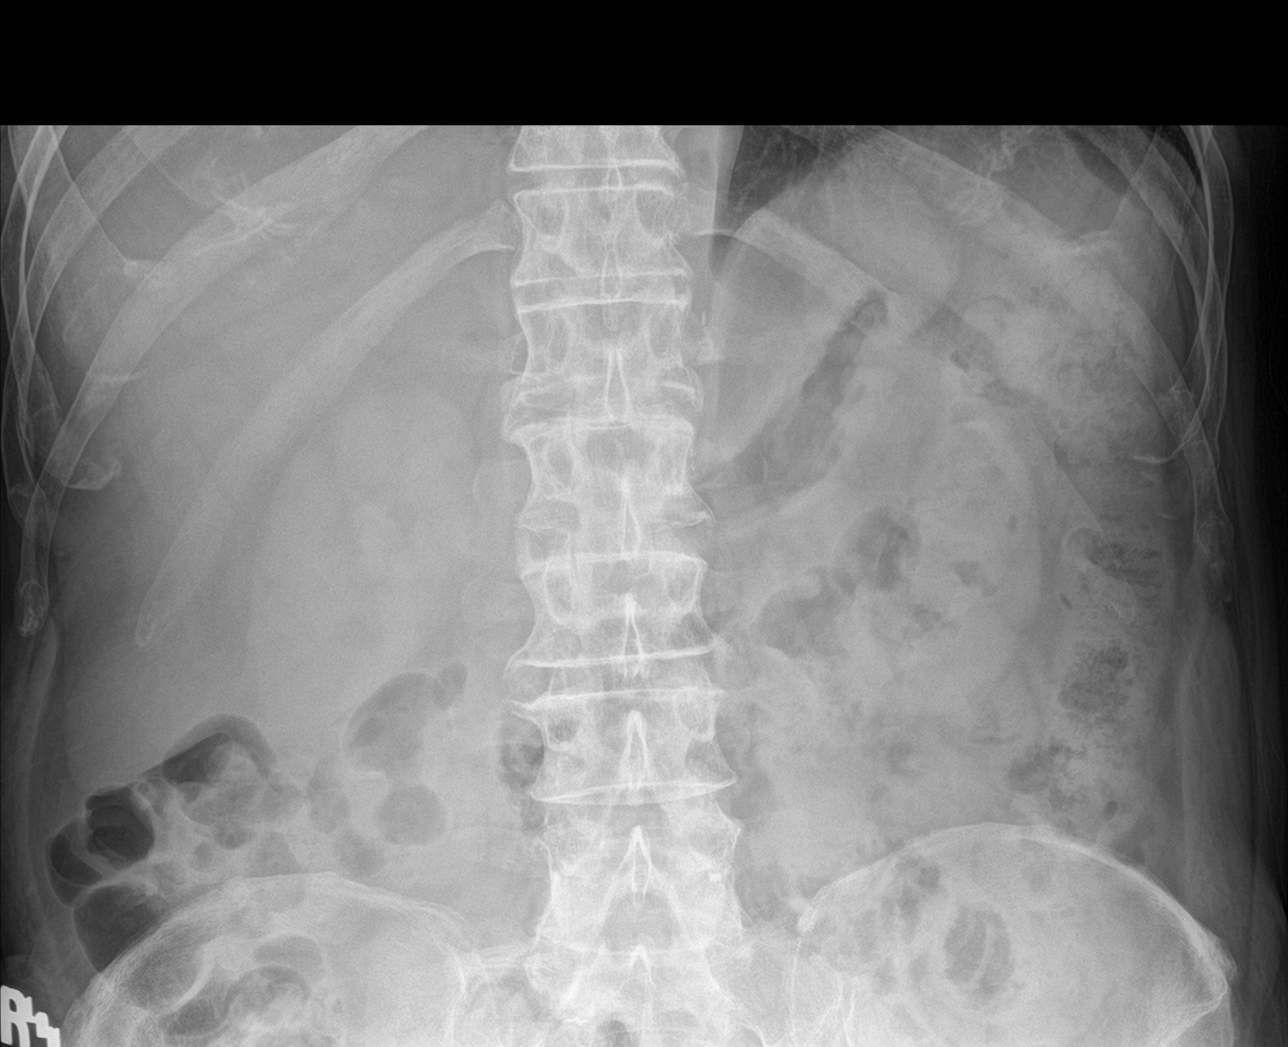

[abdomen supine (2 of 2)]
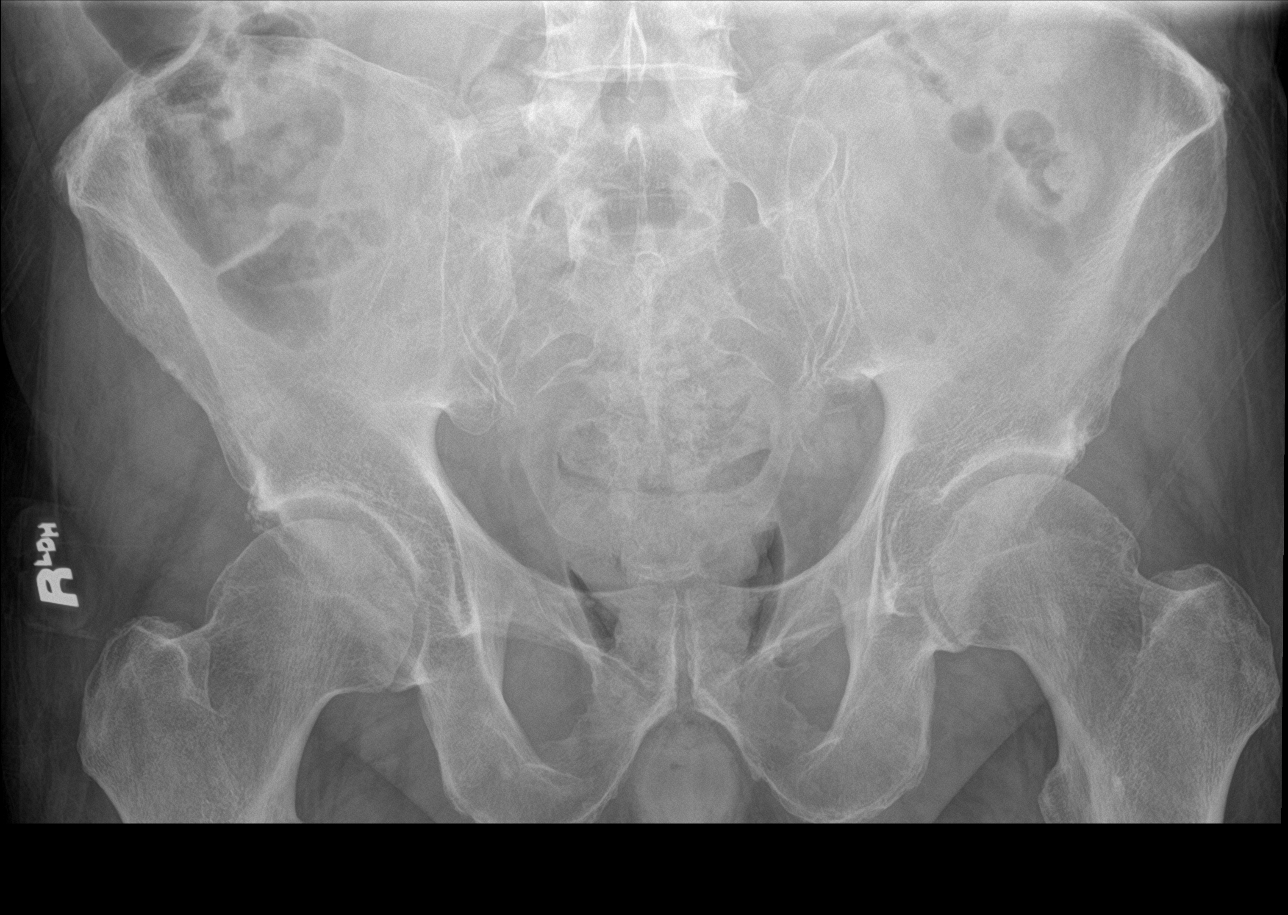

[3 of 3 positions shown; findings below may reference images not displayed]

FINDINGS: Bowel gas pattern is normal. No free air or free fluid. No excessive
stool in the bowel. No fecal impaction. There is some stool in the
rectum. No significant bone abnormality.
IMPRESSION: Benign-appearing abdomen. No excessive stool.

## 2019-01-03 MED ORDER — POLYETHYLENE GLYCOL 3350 17 G PO PACK: 17.0000 g | PACK | Freq: Every day | ORAL | 0 refills | Status: DC

## 2019-01-03 MED ORDER — LIDOCAINE HCL URETHRAL/MUCOSAL 2 % EX GEL
1.0000 "application " | Freq: Once | CUTANEOUS | Status: AC
  Administered 2019-01-03: 1 via TOPICAL
  Filled 2019-01-03: qty 10

## 2019-01-03 NOTE — ED Notes (Addendum)
Pt returned from Xray in NAD. Wife at bedside. Call bell in reach

## 2019-01-03 NOTE — ED Notes (Signed)
Pt reporting he attempted to have a BM but was unsuccessful.

## 2019-01-03 NOTE — ED Notes (Signed)
MD attempted disimpaction but pt was unable to have a BM. MD at bedside to attempt disimpaction again.

## 2019-01-03 NOTE — ED Triage Notes (Signed)
Says he has a fecal impaction.

## 2019-01-03 NOTE — ED Provider Notes (Addendum)
[facility] Emergency Department Provider Note       Time seen: ----------------------------------------- 8:30 AM on 01/03/2019 -----------------------------------------   I have reviewed the triage vital signs and the nursing notes.  HISTORY   Chief Complaint Abdominal Pain and Rectal Bleeding   HPI Brendan Larson is a 63 y.o. male with a history of arthritis, asthma, depression, hyperlipidemia, hypertension who presents to the ED for suspected fecal impaction.  Patient states he just stopped taking metformin due to the side effects, has tried MiraLAX with no effect.  Last bowel movement was yesterday, typically has 2 bowel movements in the morning.  Patient has had increased straining for stool.  Past Medical History:  Diagnosis Date  . Arthritis   . Asthma   . Cancer (HCC)    PROSTATE WITH METS TO BONE  . Chronic left shoulder pain 07/28/2017  . Depression   . GERD (gastroesophageal reflux disease)   . Heart murmur    Mitral Valve Prolapse - asymptomatic  . Hip pain, chronic, right 07/28/2017  . Hyperlipidemia   . Hypertension   . Motion sickness    small ocean vessels  . Sleep apnea    mild.  No CPAP  . Tuberculosis    had in 1990s.  Treated.    There are no active problems to display for this patient.   Past Surgical History:  Procedure Laterality Date  . COLONOSCOPY WITH PROPOFOL N/A 09/21/2017   Procedure: COLONOSCOPY WITH PROPOFOL;  Surgeon: Lollie Sails, MD;  Location: [facility] ENDOSCOPY;  Service: Endoscopy;  Laterality: N/A;  . ESOPHAGOGASTRODUODENOSCOPY (EGD) WITH PROPOFOL N/A 09/21/2017   Procedure: ESOPHAGOGASTRODUODENOSCOPY (EGD) WITH PROPOFOL;  Surgeon: Lollie Sails, MD;  Location: [facility] ENDOSCOPY;  Service: Endoscopy;  Laterality: N/A;  . KNEE ARTHROSCOPY WITH MEDIAL MENISECTOMY Left 09/28/2018   Procedure: KNEE ARTHROSCOPY WITH PARTIAL  MEDIAL MENISECTOMY;  Surgeon: Leim Fabry, MD;  Location: [facility];   Service: Orthopedics;  Laterality: Left;  sleep apnea  . TONSILECTOMY, ADENOIDECTOMY, BILATERAL MYRINGOTOMY AND TUBES    . TONSILLECTOMY      Allergies Lisinopril-hydrochlorothiazide, Paxil [paroxetine hcl], Penicillins, and Statins  Social History Social History   Tobacco Use  . Smoking status: Current Some Day Smoker  . Smokeless tobacco: Never Used  . Tobacco comment: occasional.  Never more tha PPWeek  Substance Use Topics  . Alcohol use: Yes    Alcohol/week: 8.0 standard drinks    Types: 2 Cans of beer, 3 Glasses of wine, 3 Shots of liquor per week  . Drug use: No   Review of Systems Constitutional: Negative for fever. Cardiovascular: Negative for chest pain. Respiratory: Negative for shortness of breath. Gastrointestinal: Positive for abdominal pain, constipation Musculoskeletal: Negative for back pain. Skin: Negative for rash. Neurological: Negative for headaches, focal weakness or numbness.  All systems negative/normal/unremarkable except as stated in the HPI  ____________________________________________   PHYSICAL EXAM:  VITAL SIGNS: ED Triage Vitals  Enc Vitals Group     BP 01/03/19 0826 130/67     Pulse Rate 01/03/19 0826 72     Resp 01/03/19 0826 16     Temp 01/03/19 0826 98.6 F (37 C)     Temp Source 01/03/19 0826 Oral     SpO2 01/03/19 0826 100 %     Weight 01/03/19 0826 212 lb 1.3 oz (96.2 kg)     Height --      Head Circumference --      Peak Flow --  Pain Score 01/03/19 0825 7     Pain Loc --      Pain Edu? --      Excl. in [place]? --    Constitutional: Alert and oriented. Well appearing and in no distress. Cardiovascular: Normal rate, regular rhythm. No murmurs, rubs, or gallops. Respiratory: Normal respiratory effort without tachypnea nor retractions. Breath sounds are clear and equal bilaterally. No wheezes/rales/rhonchi. Gastrointestinal: Distended, nontender, normal bowel sounds Rectal: Fecal impaction is noted, no blood is  noted Musculoskeletal: Nontender with normal range of motion in extremities. No lower extremity tenderness nor edema. Neurologic:  Normal speech and language. No gross focal neurologic deficits are appreciated.  Skin:  Skin is warm, dry and intact. No rash noted. ____________________________________________  ED COURSE:  As part of my medical decision making, I reviewed the following data within the [place] History obtained from family if available, nursing notes, old chart and ekg, as well as notes from prior ED visits. Patient presented for abdominal pain and constipation, we will assess with labs and imaging as indicated at this time.   Procedures  Brendan Larson was evaluated in Emergency Department on 01/03/2019 for the symptoms described in the history of present illness. He was evaluated in the context of the global COVID-19 pandemic, which necessitated consideration that the patient might be at risk for infection with the SARS-CoV-2 virus that causes COVID-19. Institutional protocols and algorithms that pertain to the evaluation of patients at risk for COVID-19 are in a state of rapid change based on information released by regulatory bodies including the CDC and federal and state organizations. These policies and algorithms were followed during the patient's care in the ED.  ____________________________________________   LABS (pertinent positives/negatives)  Labs Reviewed  CBC WITH DIFFERENTIAL/PLATELET - Abnormal; Notable for the following components:      Result Value   RBC 4.01 (*)    Hemoglobin 12.3 (*)    HCT 36.3 (*)    All other components within normal limits  BASIC METABOLIC PANEL    RADIOLOGY Images were viewed by me  Abdomen 2 view IMPRESSION:  Benign-appearing abdomen. No excessive stool.  ____________________________________________   DIFFERENTIAL DIAGNOSIS   Constipation, fecal impaction, dehydration, electrolyte normality  FINAL  ASSESSMENT AND PLAN  Constipation with fecal impaction   Plan: The patient had presented for constipation and fecal impaction. Patient's labs did not reveal any acute process. Patient's imaging was reassuring, there was stool in his rectum which I disimpacted twice.  He was then able to have a bowel movement.  He is cleared for outpatient follow-up.   Laurence Aly, MD    Note: This note was generated in part or whole with voice recognition software. Voice recognition is usually quite accurate but there are transcription errors that can and very often do occur. I apologize for any typographical errors that were not detected and corrected.     Earleen Newport, MD 01/03/19 [id number]    Earleen Newport, MD 01/03/19 [phone]

## 2019-01-03 NOTE — ED Triage Notes (Signed)
C/O "suspected fecal impaction".  States just stopped taking metformin due to side effects.  Has tried Miralax with no effect.  Last BM yesterday.  STates typically has two BM's in the morning, but for past week BM's have been hard and has had some bleeding with them.  Increased straining.

## 2019-01-08 ENCOUNTER — Encounter: Admit: 2019-01-08 | Discharge: 2019-01-08 | Payer: MEDICARE

## 2019-01-08 DIAGNOSIS — C61 Malignant neoplasm of prostate: Secondary | ICD-10-CM

## 2019-01-08 DIAGNOSIS — Z5111 Encounter for antineoplastic chemotherapy: Secondary | ICD-10-CM

## 2019-01-08 DIAGNOSIS — C7951 Secondary malignant neoplasm of bone: Secondary | ICD-10-CM

## 2019-01-08 MED ORDER — AMLODIPINE 5 MG TABLET
ORAL_TABLET | 1 refills | 0 days | Status: CP
Start: 2019-01-08 — End: 2019-02-04

## 2019-01-10 ENCOUNTER — Encounter: Admit: 2019-01-10 | Discharge: 2019-01-11 | Payer: MEDICARE | Attending: Internal Medicine | Primary: Internal Medicine

## 2019-01-10 DIAGNOSIS — C7951 Secondary malignant neoplasm of bone: Secondary | ICD-10-CM

## 2019-01-10 DIAGNOSIS — C61 Malignant neoplasm of prostate: Principal | ICD-10-CM

## 2019-02-04 MED ORDER — AMLODIPINE 5 MG TABLET
ORAL_TABLET | 1 refills | 0 days | Status: CP
Start: 2019-02-04 — End: ?

## 2019-03-06 MED ORDER — AMLODIPINE 5 MG TABLET
ORAL_TABLET | 1 refills | 0 days | Status: CP
Start: 2019-03-06 — End: ?

## 2019-04-05 MED ORDER — AMLODIPINE 5 MG TABLET
ORAL_TABLET | 11 refills | 0 days | Status: CP
Start: 2019-04-05 — End: ?

## 2019-04-15 ENCOUNTER — Encounter: Admit: 2019-04-15 | Discharge: 2019-04-15 | Payer: MEDICARE | Attending: Internal Medicine | Primary: Internal Medicine

## 2019-04-15 ENCOUNTER — Encounter: Admit: 2019-04-15 | Discharge: 2019-04-15 | Payer: MEDICARE

## 2019-04-15 ENCOUNTER — Institutional Professional Consult (permissible substitution): Admit: 2019-04-15 | Discharge: 2019-04-15 | Payer: MEDICARE

## 2019-04-15 DIAGNOSIS — C61 Malignant neoplasm of prostate: Principal | ICD-10-CM

## 2019-04-15 DIAGNOSIS — C7951 Secondary malignant neoplasm of bone: Principal | ICD-10-CM

## 2019-05-06 ENCOUNTER — Emergency Department
Admission: EM | Admit: 2019-05-06 | Discharge: 2019-05-06 | Disposition: A | Discharge status: Home / Self Care | Payer: Federal, State, Local not specified - PPO | Attending: Emergency Medicine | Admitting: Emergency Medicine

## 2019-05-06 ENCOUNTER — Encounter: Payer: Self-pay | Admitting: Emergency Medicine

## 2019-05-06 DIAGNOSIS — Z5321 Procedure and treatment not carried out due to patient leaving prior to being seen by health care provider: Secondary | ICD-10-CM | POA: Diagnosis not present

## 2019-05-06 DIAGNOSIS — R1012 Left upper quadrant pain: Secondary | ICD-10-CM | POA: Insufficient documentation

## 2019-05-06 LAB — URINALYSIS, COMPLETE (UACMP) WITH MICROSCOPIC
Bacteria, UA: NONE SEEN
Bilirubin Urine: NEGATIVE
Glucose, UA: NEGATIVE mg/dL
Ketones, ur: 5 mg/dL — AB
Leukocytes,Ua: NEGATIVE
Nitrite: NEGATIVE
Protein, ur: NEGATIVE mg/dL
Specific Gravity, Urine: 1.018 (ref 1.005–1.030)
Squamous Epithelial / HPF: NONE SEEN (ref 0–5)
pH: 6 (ref 5.0–8.0)

## 2019-05-06 LAB — COMPREHENSIVE METABOLIC PANEL
ALT: 48 U/L — ABNORMAL HIGH (ref 0–44)
AST: 41 U/L (ref 15–41)
Albumin: 4.5 g/dL (ref 3.5–5.0)
Alkaline Phosphatase: 78 U/L (ref 38–126)
Anion gap: 12 (ref 5–15)
BUN: 17 mg/dL (ref 8–23)
CO2: 28 mmol/L (ref 22–32)
Calcium: 9.5 mg/dL (ref 8.9–10.3)
Chloride: 98 mmol/L (ref 98–111)
Creatinine, Ser: 1.12 mg/dL (ref 0.61–1.24)
GFR calc Af Amer: >60 mL/min (ref >60)
GFR calc non Af Amer: >60 mL/min (ref >60)
Glucose, Bld: 141 mg/dL — ABNORMAL HIGH (ref 70–99)
Potassium: 3.9 mmol/L (ref 3.5–5.1)
Sodium: 138 mmol/L (ref 135–145)
Total Bilirubin: 0.7 mg/dL (ref 0.3–1.2)
Total Protein: 7.6 g/dL (ref 6.5–8.1)

## 2019-05-06 LAB — CBC
HCT: 39.5 % (ref 39.0–52.0)
Hemoglobin: 13.4 g/dL (ref 13.0–17.0)
MCH: 30.2 pg (ref 26.0–34.0)
MCHC: 33.9 g/dL (ref 30.0–36.0)
MCV: 89.2 fL (ref 80.0–100.0)
Platelets: 167 10*3/uL (ref 150–400)
RBC: 4.43 MIL/uL (ref 4.22–5.81)
RDW: 14.6 % (ref 11.5–15.5)
WBC: 8.7 10*3/uL (ref 4.0–10.5)
nRBC: 0 % (ref 0.0–0.2)

## 2019-05-06 LAB — LIPASE, BLOOD: Lipase: 26 U/L (ref 11–51)

## 2019-05-06 NOTE — ED Notes (Signed)
Pt reports leaving now due to long wait and will return if worse

## 2019-05-06 NOTE — ED Triage Notes (Signed)
Pt c/o left flank pain that radiates into the left side of groin x2 days. Pt denies dysuria and hematuria.

## 2019-05-08 ENCOUNTER — Telehealth: Payer: Self-pay | Admitting: Emergency Medicine

## 2019-05-08 NOTE — Telephone Encounter (Signed)
Called patient due to lwot to inquire about condition and follow up plans.says he left because his pain decreased,but he has an appt with his pcp.

## 2019-05-09 ENCOUNTER — Ambulatory Visit
Admission: RE | Admit: 2019-05-09 | Discharge: 2019-05-09 | Disposition: A | Discharge status: Home / Self Care | Payer: Federal, State, Local not specified - PPO | Source: Ambulatory Visit | Attending: Family Medicine | Admitting: Family Medicine

## 2019-05-09 ENCOUNTER — Other Ambulatory Visit: Payer: Self-pay | Admitting: Family Medicine

## 2019-05-09 DIAGNOSIS — R319 Hematuria, unspecified: Secondary | ICD-10-CM

## 2019-05-09 IMAGING — CR DG ABDOMEN 1V
1 series · 2 of 2 positions shown · non-contrast
Comparison: [DATE]

CLINICAL DATA: Hematuria

EXAM:
ABDOMEN - 1 VIEW

[Series 1: dg abd 1 view · 0.14mm/px · 2 of 2 slices shown]
[im 1/2]
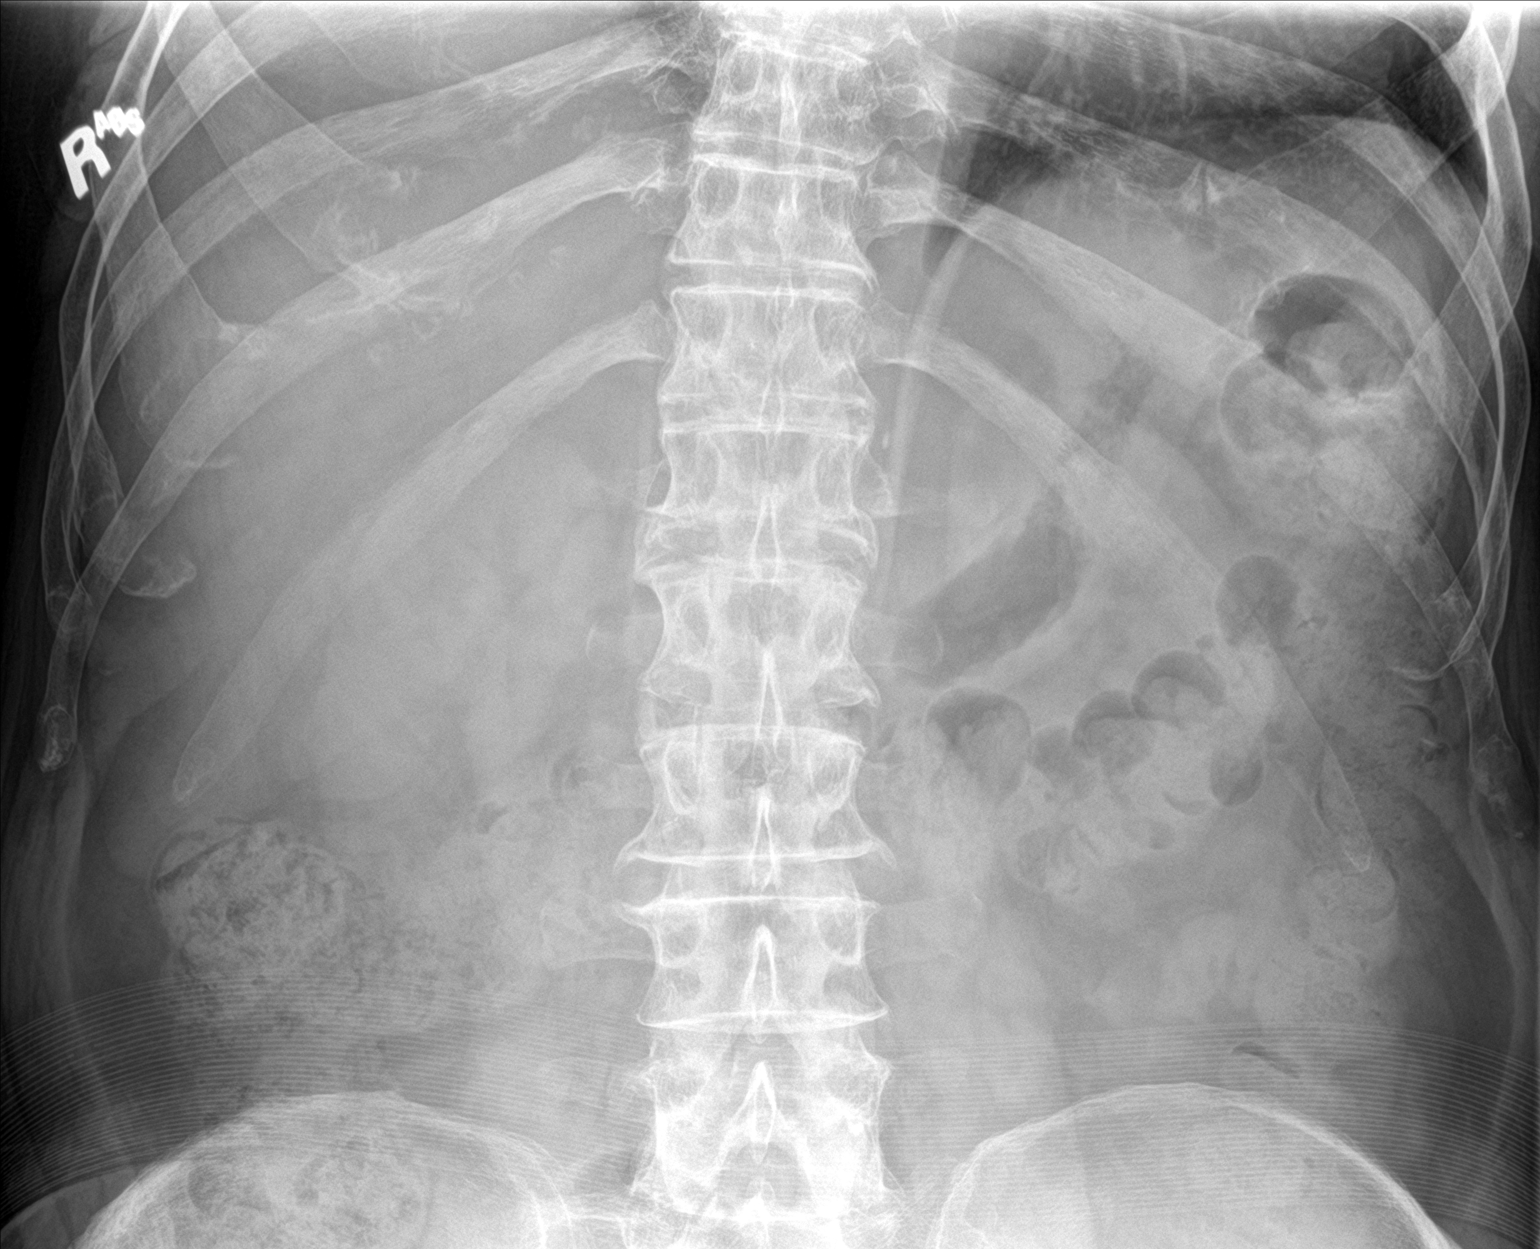
[im 2/2]
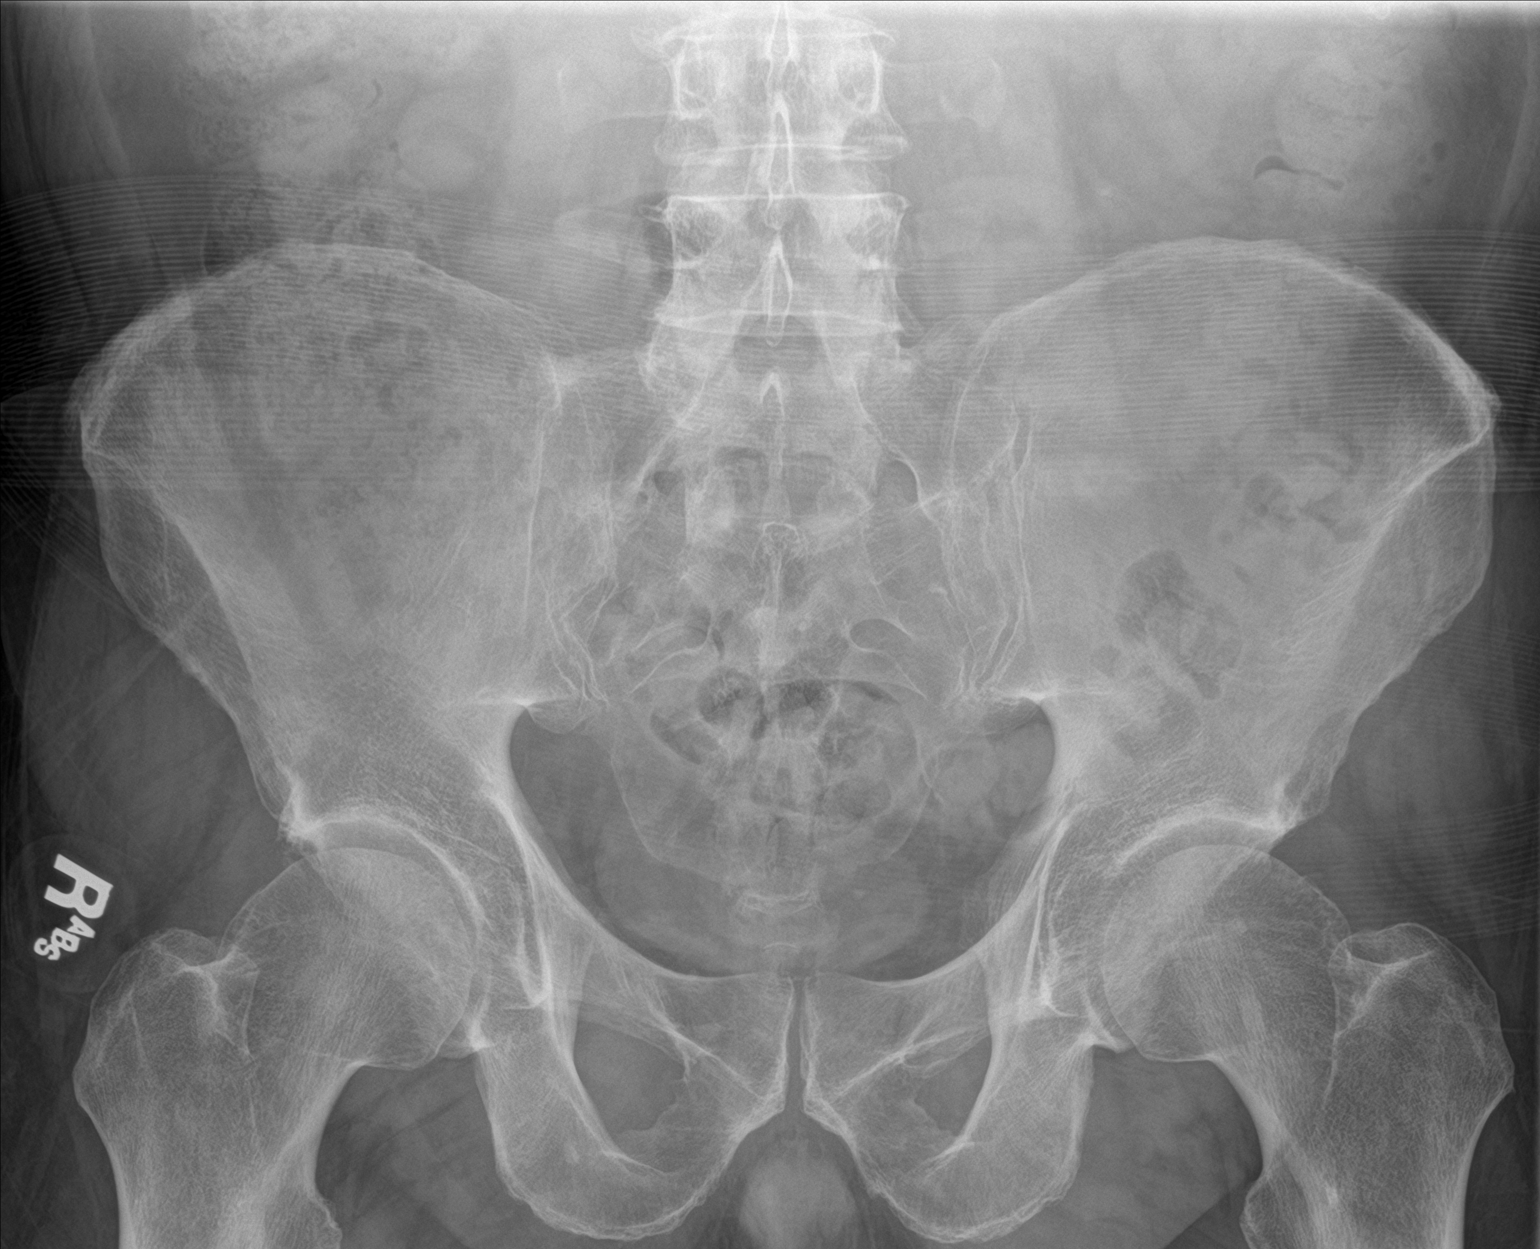

[2 of 2 positions shown; findings below may reference images not displayed]

FINDINGS: Nonobstructed gas pattern with moderate stool. No radiopaque calculi
over the kidneys. Questionable punctate calcifications over the left
mid sacrum and left pelvis.
IMPRESSION: 1. Questionable punctate calcifications over the left mid sacrum and
pelvis, if ureteral stone is suspected, further evaluation with CT
KUB could be obtained
2. Nonobstructed gas pattern

## 2019-07-15 ENCOUNTER — Encounter: Admit: 2019-07-15 | Discharge: 2019-07-16 | Payer: MEDICARE | Attending: Family | Primary: Family

## 2019-07-15 ENCOUNTER — Encounter: Admit: 2019-07-15 | Discharge: 2019-07-16 | Payer: MEDICARE

## 2019-07-15 DIAGNOSIS — C7951 Secondary malignant neoplasm of bone: Secondary | ICD-10-CM

## 2019-07-15 DIAGNOSIS — C61 Malignant neoplasm of prostate: Principal | ICD-10-CM

## 2019-07-15 DIAGNOSIS — M8580 Other specified disorders of bone density and structure, unspecified site: Principal | ICD-10-CM

## 2019-09-09 ENCOUNTER — Other Ambulatory Visit: Payer: Self-pay | Admitting: Orthopedic Surgery

## 2019-09-09 DIAGNOSIS — M25561 Pain in right knee: Secondary | ICD-10-CM

## 2019-09-18 ENCOUNTER — Ambulatory Visit
Admission: RE | Admit: 2019-09-18 | Discharge: 2019-09-18 | Disposition: A | Discharge status: Home / Self Care | Payer: Federal, State, Local not specified - PPO | Source: Ambulatory Visit | Attending: Orthopedic Surgery | Admitting: Orthopedic Surgery

## 2019-09-18 ENCOUNTER — Other Ambulatory Visit: Payer: Self-pay

## 2019-09-18 DIAGNOSIS — M25561 Pain in right knee: Secondary | ICD-10-CM | POA: Insufficient documentation

## 2019-09-18 IMAGING — MR MR KNEE*R* W/O CM
6 series · 40 of 40 positions shown · non-contrast
Comparison: None.

CLINICAL DATA: Medial right knee pain since [DATE].

EXAM:
MRI OF THE RIGHT KNEE WITHOUT CONTRAST
TECHNIQUE: Multiplanar, multisequence MR imaging of the knee was performed. No
intravenous contrast was administered.

[Series 8: T2 fat-sat · axial · right · 4.0mm · 0.50mm/px · z∈[-102,+38]mm · 6 of 29 slices shown (1 of 3)]
[im 1/29]
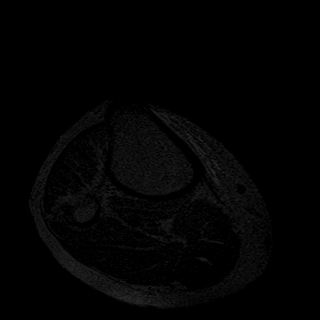
[im 6/29]
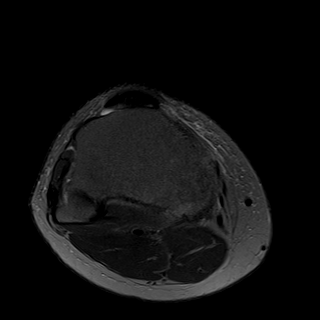
[im 12/29]
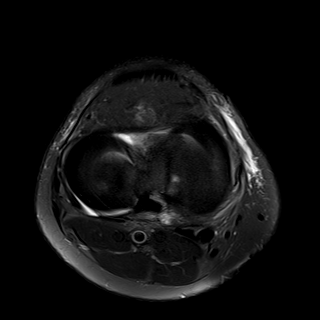
[im 17/29]
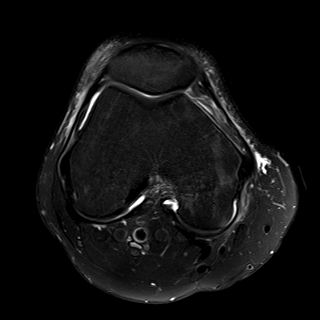
[im 23/29]
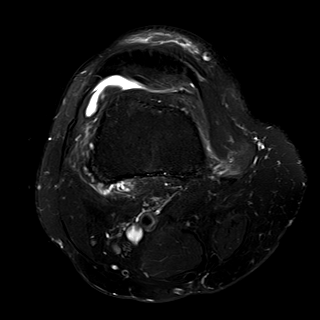
[im 29/29]
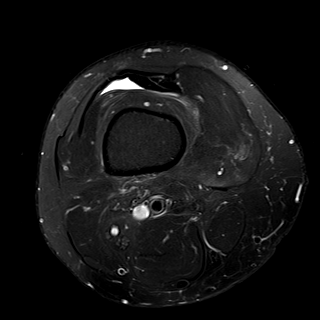

[Series 9: T2 fat-sat · coronal · right · 4.0mm · 0.59mm/px · 6 of 27 slices shown (2 of 3)]
[im 1/27]
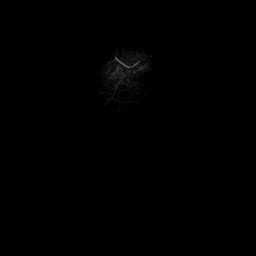
[im 6/27]
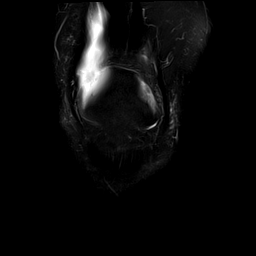
[im 11/27]
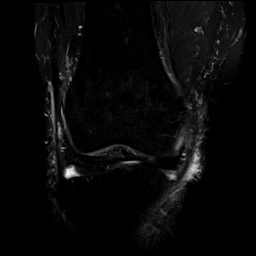
[im 16/27]
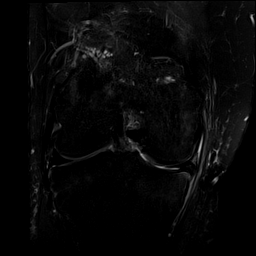
[im 21/27]
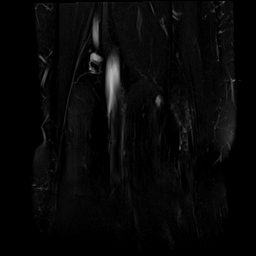
[im 27/27]
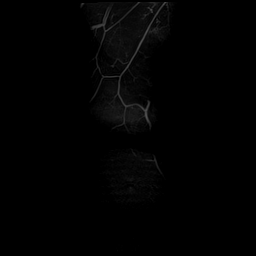

[Series 10: T1 · coronal · right · 4.0mm · 0.59mm/px · 7 of 28 slices shown]
[im 1/28]
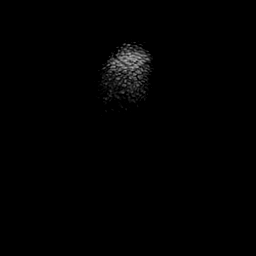
[im 5/28]
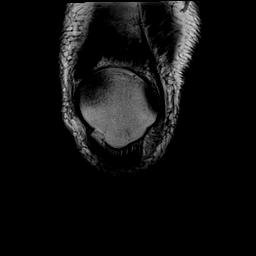
[im 10/28]
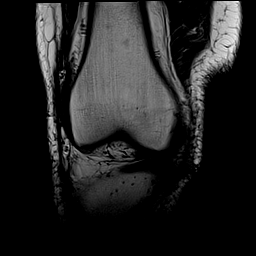
[im 14/28]
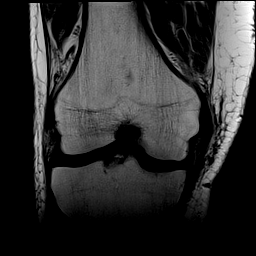
[im 19/28]
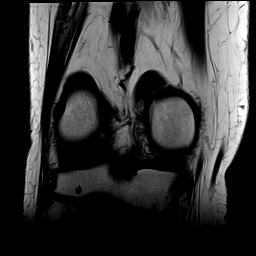
[im 23/28]
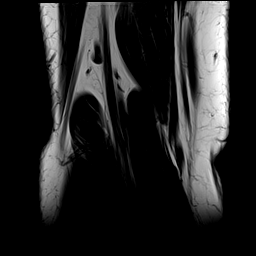
[im 28/28]
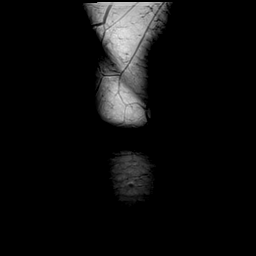

[Series 11: PD fat-sat · coronal · right · 4.0mm · 0.59mm/px · 7 of 28 slices shown (1 of 2)]
[im 1/28]
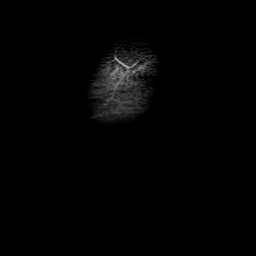
[im 5/28]
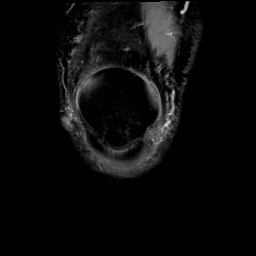
[im 10/28]
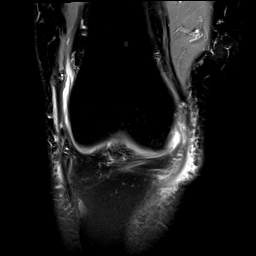
[im 14/28]
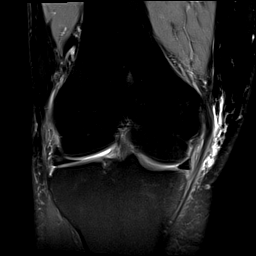
[im 19/28]
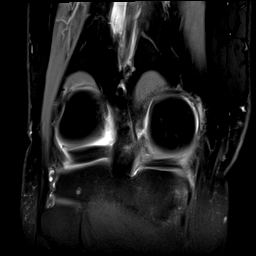
[im 23/28]
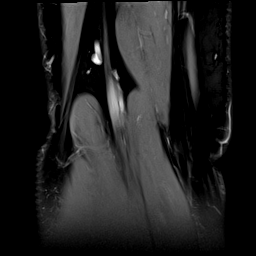
[im 28/28]
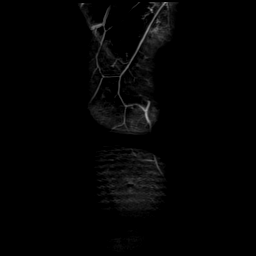

[Series 12: PD fat-sat · sagittal · right · 3.0mm · 0.59mm/px · 7 of 29 slices shown (2 of 2)]
[im 1/29]
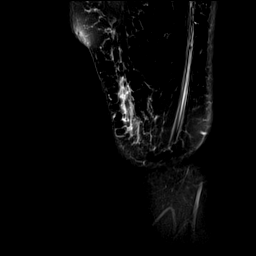
[im 5/29]
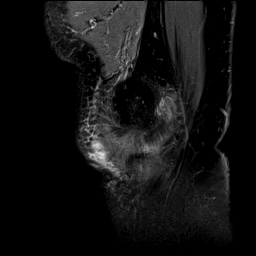
[im 10/29]
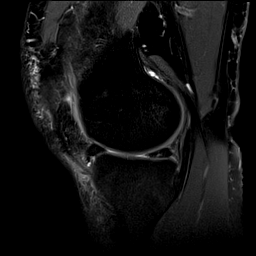
[im 15/29]
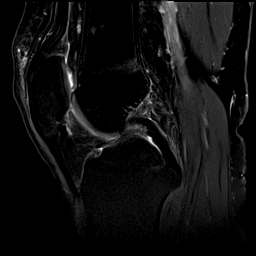
[im 19/29]
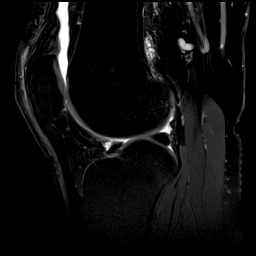
[im 24/29]
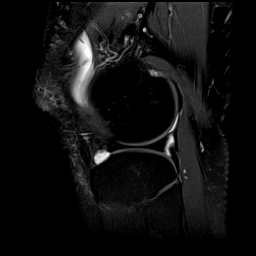
[im 29/29]
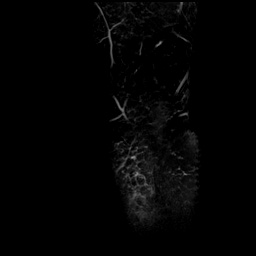

[Series 13: T2 fat-sat · sagittal · right · 3.0mm · 0.59mm/px · 7 of 30 slices shown (3 of 3)]
[im 1/30]
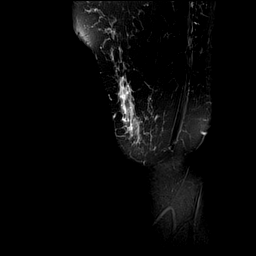
[im 5/30]
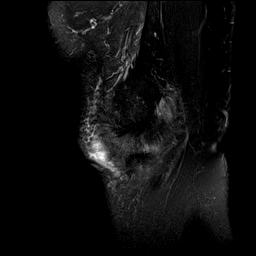
[im 10/30]
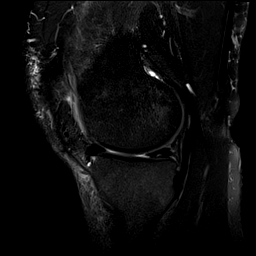
[im 15/30]
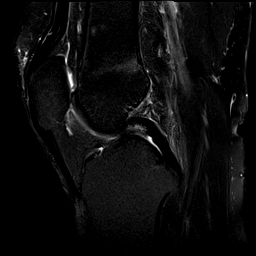
[im 20/30]
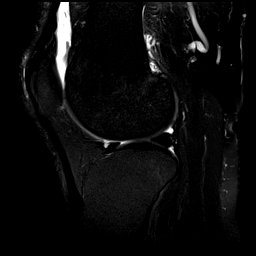
[im 25/30]
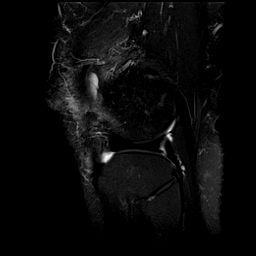
[im 30/30]
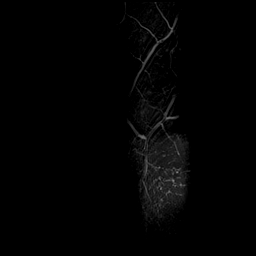

[40 of 40 positions shown; findings below may reference images not displayed]

FINDINGS: MENISCI

Medial meniscus: A large horizontal tear in the posterior horn
reaches the meniscal undersurface and extends to the mid meniscal
body. Small longitudinal component is seen in the mid meniscal body.

Lateral meniscus:  Intact.

LIGAMENTS

Cruciates:  Intact.

Collaterals: Intact. There is mild thickening and intrasubstance
increased T2 signal in the superior fibers of the MCL and some fluid
about the ligament consistent with grade [DATE] sprain. Lateral
collateral ligament complex appears normal.

CARTILAGE

Patellofemoral:  Preserved.

Medial:  Minimally degenerated.

Lateral:  Preserved.

Joint:  Small effusion.

Popliteal Fossa:  No Baker's cyst.

Extensor Mechanism:  Intact.

Bones: No fracture or worrisome lesion. Minimal subchondral edema
about the medial compartment noted.

Other: None.
IMPRESSION: Tear of the posterior horn and body of the medial meniscus as
described above.

Grade [DATE] MCL sprain.  The ligament is intact.

Mild medial compartment degenerative change.

## 2019-09-23 ENCOUNTER — Other Ambulatory Visit: Payer: Self-pay | Admitting: Orthopedic Surgery

## 2019-10-01 ENCOUNTER — Encounter
Admission: RE | Admit: 2019-10-01 | Discharge: 2019-10-01 | Disposition: A | Discharge status: Home / Self Care | Payer: Federal, State, Local not specified - PPO | Source: Ambulatory Visit | Attending: Orthopedic Surgery | Admitting: Orthopedic Surgery

## 2019-10-01 ENCOUNTER — Other Ambulatory Visit: Payer: Self-pay

## 2019-10-01 DIAGNOSIS — Z01818 Encounter for other preprocedural examination: Secondary | ICD-10-CM | POA: Diagnosis not present

## 2019-10-01 HISTORY — DX: Anemia, unspecified: D64.9

## 2019-10-01 HISTORY — DX: Prediabetes: R73.03

## 2019-10-01 HISTORY — DX: Acute myocardial infarction, unspecified: I21.9

## 2019-10-01 NOTE — Patient Instructions (Signed)
Your procedure is scheduled on: 10-07-19 MONDAY Report to Same Day Surgery 2nd floor medical mall [facility] Entrance-take elevator on left to 2nd floor.  Check in with surgery information desk.) To find out your arrival time please call [phone] between 1PM - 3PM on 10-04-19 FRIDAY  Remember: Instructions that are not followed completely may result in serious medical risk, up to and including death, or upon the discretion of your surgeon and anesthesiologist your surgery may need to be rescheduled.    _x___ 1. Do not eat food after midnight the night before your procedure. NO GUM OR CANDY AFTER MIDNIGHT. You may drink clear liquids up to 2 hours before you are scheduled to arrive at the hospital for your procedure.  Do not drink clear liquids within 2 hours of your scheduled arrival to the hospital.  Clear liquids include  --Water or Apple juice without pulp  --Gatorade  --Black Coffee or Clear Tea (No milk, no creamers, do not add anything to the coffee or Tea-sugar ok to add)   ____Ensure clear carbohydrate drink on the way to the hospital for bariatric patients  _X___Ensure clear carbohydrate drink-FINISH DRINK 2 HOURS PRIOR TO ARRIVAL [place]    __x__ 2. No Alcohol for 24 hours before or after surgery.   __x__3. No Smoking or e-cigarettes for 24 prior to surgery.  Do not use any chewable tobacco products for at least 6 hour prior to surgery   ____  4. Bring all medications with you on the day of surgery if instructed.    __x__ 5. Notify your doctor if there is any change in your medical condition     (cold, fever, infections).    x___6. On the morning of surgery brush your teeth with toothpaste and water.  You may rinse your mouth with mouth wash if you wish.  Do not swallow any toothpaste or mouthwash.   Do not wear jewelry, make-up, hairpins, clips or nail polish.  Do not wear lotions, powders, or perfumes.   Do not shave 48 hours prior to  surgery. Men may shave face and neck.  Do not bring valuables to the hospital.    [facility] is not responsible for any belongings or valuables.               Contacts, dentures or bridgework may not be worn into surgery.  Leave your suitcase in the car. After surgery it may be brought to your room.  For patients admitted to the hospital, discharge time is determined by your treatment team.  _  Patients discharged the day of surgery will not be allowed to drive home.  You will need someone to drive you home and stay with you the night of your procedure.    Please read over the following fact sheets that you were given:   [facility] Preparing for Surgery/INCENTIVE SPIROMETER INSTRUCTIONS  _x___ TAKE THE FOLLOWING MEDICATION THE MORNING OF SURGERY WITH A SMALL SIP OF WATER. These include:  1. NORVASC (AMLODIPINE)  2. FLOMAX (TAMSULOSIN)  3.  4.  5.  6.  ____Fleets enema or Magnesium Citrate as directed.   _x___ Use CHG Soap or sage wipes as directed on instruction sheet   ____ Use inhalers on the day of surgery and bring to hospital day of surgery  ____ Stop Metformin and Janumet 2 days prior to surgery.    ____ Take 1/2 of usual insulin dose the night before surgery and  none on the morning surgery.   ____ Follow recommendations from Cardiologist, Pulmonologist or PCP regarding stopping Aspirin, Coumadin, Plavix ,Eliquis, Effient, or Pradaxa, and Pletal.  X____Stop Anti-inflammatories such as Advil, Aleve, Ibuprofen, Motrin, Naproxen, Naprosyn, Goodies powders or aspirin products NOW-OK to take Tylenol    ____ Stop supplements until after surgery.     ____ Bring C-Pap to the hospital.

## 2019-10-01 NOTE — Pre-Procedure Instructions (Signed)
ECG 12-lead11/28/2016 [place] Component Name Value Ref Range  Vent Rate (bpm) 65   PR Interval (msec) 148   QRS Interval (msec) 112   QT Interval (msec) 420   QTc (msec) 436   Other Result Information  This result has an attachment that is not available.  Result Narrative  Normal sinus rhythm Incomplete right bundle branch block Abnormal ECG  No previous ECGs available I reviewed and concur with this report. Electronically signed [id number], MD, CHRISTOPHER [phone]) on 03/30/2015 7:50:24 PM  Status Results Details

## 2019-10-02 ENCOUNTER — Encounter
Admission: RE | Admit: 2019-10-02 | Discharge: 2019-10-02 | Disposition: A | Discharge status: Home / Self Care | Payer: Medicare Other | Source: Ambulatory Visit | Attending: Orthopedic Surgery | Admitting: Orthopedic Surgery

## 2019-10-02 DIAGNOSIS — R001 Bradycardia, unspecified: Secondary | ICD-10-CM | POA: Diagnosis not present

## 2019-10-02 DIAGNOSIS — Z01818 Encounter for other preprocedural examination: Secondary | ICD-10-CM | POA: Diagnosis not present

## 2019-10-02 DIAGNOSIS — I10 Essential (primary) hypertension: Secondary | ICD-10-CM | POA: Diagnosis not present

## 2019-10-02 DIAGNOSIS — I341 Nonrheumatic mitral (valve) prolapse: Secondary | ICD-10-CM | POA: Insufficient documentation

## 2019-10-02 DIAGNOSIS — I4510 Unspecified right bundle-branch block: Secondary | ICD-10-CM | POA: Insufficient documentation

## 2019-10-02 LAB — POTASSIUM: Potassium: 3.7 mmol/L (ref 3.5–5.1)

## 2019-10-03 ENCOUNTER — Other Ambulatory Visit: Payer: Self-pay

## 2019-10-03 ENCOUNTER — Other Ambulatory Visit
Admission: RE | Admit: 2019-10-03 | Discharge: 2019-10-03 | Disposition: A | Discharge status: Home / Self Care | Payer: Medicare Other | Source: Ambulatory Visit | Attending: Orthopedic Surgery | Admitting: Orthopedic Surgery

## 2019-10-03 DIAGNOSIS — Z01812 Encounter for preprocedural laboratory examination: Secondary | ICD-10-CM | POA: Insufficient documentation

## 2019-10-03 DIAGNOSIS — Z20822 Contact with and (suspected) exposure to covid-19: Secondary | ICD-10-CM | POA: Insufficient documentation

## 2019-10-03 LAB — SARS CORONAVIRUS 2 (TAT 6-24 HRS): SARS Coronavirus 2: NEGATIVE

## 2019-10-07 ENCOUNTER — Ambulatory Visit: Payer: Medicare Other | Admitting: Certified Registered"

## 2019-10-07 ENCOUNTER — Ambulatory Visit
Admission: RE | Admit: 2019-10-07 | Discharge: 2019-10-07 | Disposition: A | Discharge status: Home / Self Care | Payer: Medicare Other | Source: Ambulatory Visit | Attending: Orthopedic Surgery | Admitting: Orthopedic Surgery

## 2019-10-07 ENCOUNTER — Encounter
Admission: RE | Disposition: A | Discharge status: Home / Self Care | Payer: Self-pay | Source: Ambulatory Visit | Attending: Orthopedic Surgery

## 2019-10-07 ENCOUNTER — Other Ambulatory Visit: Payer: Self-pay

## 2019-10-07 ENCOUNTER — Encounter: Payer: Self-pay | Admitting: Orthopedic Surgery

## 2019-10-07 DIAGNOSIS — M23221 Derangement of posterior horn of medial meniscus due to old tear or injury, right knee: Secondary | ICD-10-CM | POA: Insufficient documentation

## 2019-10-07 DIAGNOSIS — M6751 Plica syndrome, right knee: Secondary | ICD-10-CM | POA: Insufficient documentation

## 2019-10-07 DIAGNOSIS — Z720 Tobacco use: Secondary | ICD-10-CM | POA: Diagnosis not present

## 2019-10-07 DIAGNOSIS — M1711 Unilateral primary osteoarthritis, right knee: Secondary | ICD-10-CM | POA: Diagnosis not present

## 2019-10-07 HISTORY — PX: KNEE ARTHROSCOPY WITH MEDIAL MENISECTOMY: SHX5651

## 2019-10-07 SURGERY — ARTHROSCOPY, KNEE, WITH MEDIAL MENISCECTOMY
Anesthesia: General | Site: Knee | Laterality: Right

## 2019-10-07 MED ORDER — VASOPRESSIN 20 UNIT/ML IV SOLN
INTRAVENOUS | Status: DC | PRN
Start: 2019-10-07 — End: 2019-10-07
  Administered 2019-10-07 (×3): 2 [IU] via INTRAVENOUS

## 2019-10-07 MED ORDER — ESMOLOL HCL 100 MG/10ML IV SOLN
INTRAVENOUS | Status: AC
  Filled 2019-10-07: qty 10

## 2019-10-07 MED ORDER — HYDROCODONE-ACETAMINOPHEN 5-325 MG PO TABS: 1.0000 | ORAL_TABLET | ORAL | 0 refills | Status: AC | PRN

## 2019-10-07 MED ORDER — ROCURONIUM BROMIDE 10 MG/ML (PF) SYRINGE
PREFILLED_SYRINGE | INTRAVENOUS | Status: AC
  Filled 2019-10-07: qty 10

## 2019-10-07 MED ORDER — PROPOFOL 10 MG/ML IV BOLUS
INTRAVENOUS | Status: DC | PRN
  Administered 2019-10-07: 200 mg via INTRAVENOUS

## 2019-10-07 MED ORDER — ASPIRIN EC 325 MG PO TBEC
325.0000 mg | DELAYED_RELEASE_TABLET | Freq: Every day | ORAL | 0 refills | Status: AC
Start: 2019-10-07 — End: 2019-10-21

## 2019-10-07 MED ORDER — FENTANYL CITRATE (PF) 100 MCG/2ML IJ SOLN
INTRAMUSCULAR | Status: AC
  Filled 2019-10-07: qty 2

## 2019-10-07 MED ORDER — EPHEDRINE SULFATE 50 MG/ML IJ SOLN
INTRAMUSCULAR | Status: DC | PRN
  Administered 2019-10-07: 5 mg via INTRAVENOUS

## 2019-10-07 MED ORDER — SUCCINYLCHOLINE CHLORIDE 200 MG/10ML IV SOSY
PREFILLED_SYRINGE | INTRAVENOUS | Status: AC
  Filled 2019-10-07: qty 10

## 2019-10-07 MED ORDER — LIDOCAINE HCL (CARDIAC) PF 100 MG/5ML IV SOSY
PREFILLED_SYRINGE | INTRAVENOUS | Status: DC | PRN
  Administered 2019-10-07: 100 mg via INTRAVENOUS

## 2019-10-07 MED ORDER — GLYCOPYRROLATE 0.2 MG/ML IJ SOLN
INTRAMUSCULAR | Status: DC | PRN
  Administered 2019-10-07: .2 mg via INTRAVENOUS

## 2019-10-07 MED ORDER — SUGAMMADEX SODIUM 500 MG/5ML IV SOLN
INTRAVENOUS | Status: AC
  Filled 2019-10-07: qty 5

## 2019-10-07 MED ORDER — FENTANYL CITRATE (PF) 100 MCG/2ML IJ SOLN
INTRAMUSCULAR | Status: AC
  Administered 2019-10-07: 25 ug via INTRAVENOUS
  Filled 2019-10-07: qty 2

## 2019-10-07 MED ORDER — SUCCINYLCHOLINE CHLORIDE 20 MG/ML IJ SOLN
INTRAMUSCULAR | Status: DC | PRN
  Administered 2019-10-07: 200 mg via INTRAVENOUS

## 2019-10-07 MED ORDER — FENTANYL CITRATE (PF) 100 MCG/2ML IJ SOLN
25.0000 ug | INTRAMUSCULAR | Status: DC | PRN
  Administered 2019-10-07: 25 ug via INTRAVENOUS
  Administered 2019-10-07: 50 ug via INTRAVENOUS

## 2019-10-07 MED ORDER — MIDAZOLAM HCL 2 MG/2ML IJ SOLN
INTRAMUSCULAR | Status: DC | PRN
  Administered 2019-10-07: 2 mg via INTRAVENOUS

## 2019-10-07 MED ORDER — LACTATED RINGERS IV SOLN: INTRAVENOUS | Status: DC

## 2019-10-07 MED ORDER — CLINDAMYCIN PHOSPHATE 900 MG/50ML IV SOLN
INTRAVENOUS | Status: AC
  Filled 2019-10-07: qty 50

## 2019-10-07 MED ORDER — EPHEDRINE 5 MG/ML INJ
INTRAVENOUS | Status: AC
  Filled 2019-10-07: qty 10

## 2019-10-07 MED ORDER — ONDANSETRON HCL 4 MG/2ML IJ SOLN
INTRAMUSCULAR | Status: AC
  Filled 2019-10-07: qty 2

## 2019-10-07 MED ORDER — VASOPRESSIN 20 UNIT/ML IV SOLN
INTRAVENOUS | Status: AC
  Filled 2019-10-07: qty 1

## 2019-10-07 MED ORDER — FENTANYL CITRATE (PF) 100 MCG/2ML IJ SOLN
INTRAMUSCULAR | Status: DC | PRN
  Administered 2019-10-07: 50 ug via INTRAVENOUS

## 2019-10-07 MED ORDER — BUPIVACAINE HCL (PF) 0.5 % IJ SOLN
INTRAMUSCULAR | Status: DC | PRN
  Administered 2019-10-07: 6 mL
  Administered 2019-10-07: .5 mL

## 2019-10-07 MED ORDER — OXYCODONE HCL 5 MG PO TABS: 5.0000 mg | ORAL_TABLET | Freq: Once | ORAL | Status: DC | PRN

## 2019-10-07 MED ORDER — PHENYLEPHRINE HCL (PRESSORS) 10 MG/ML IV SOLN
INTRAVENOUS | Status: DC | PRN
  Administered 2019-10-07 (×2): 100 ug via INTRAVENOUS

## 2019-10-07 MED ORDER — IBUPROFEN 800 MG PO TABS: 800.0000 mg | ORAL_TABLET | Freq: Three times a day (TID) | ORAL | 1 refills | Status: AC

## 2019-10-07 MED ORDER — LACTATED RINGERS IV SOLN
INTRAVENOUS | Status: DC | PRN
  Administered 2019-10-07: 1 mL

## 2019-10-07 MED ORDER — CLINDAMYCIN PHOSPHATE 900 MG/50ML IV SOLN
900.0000 mg | INTRAVENOUS | Status: AC
  Administered 2019-10-07: 900 mg via INTRAVENOUS

## 2019-10-07 MED ORDER — ACETAMINOPHEN 500 MG PO TABS: 1000.0000 mg | ORAL_TABLET | Freq: Three times a day (TID) | ORAL | 2 refills | Status: AC

## 2019-10-07 MED ORDER — DEXAMETHASONE SODIUM PHOSPHATE 10 MG/ML IJ SOLN
INTRAMUSCULAR | Status: AC
  Filled 2019-10-07: qty 1

## 2019-10-07 MED ORDER — CHLORHEXIDINE GLUCONATE 0.12 % MT SOLN: 15.0000 mL | Freq: Once | OROMUCOSAL | Status: AC

## 2019-10-07 MED ORDER — LIDOCAINE-EPINEPHRINE 1 %-1:100000 IJ SOLN
INTRAMUSCULAR | Status: DC | PRN
  Administered 2019-10-07: .5 mL
  Administered 2019-10-07: 6 mL

## 2019-10-07 MED ORDER — OXYCODONE HCL 5 MG/5ML PO SOLN: 5.0000 mg | Freq: Once | ORAL | Status: DC | PRN

## 2019-10-07 MED ORDER — ORAL CARE MOUTH RINSE: 15.0000 mL | Freq: Once | OROMUCOSAL | Status: AC

## 2019-10-07 MED ORDER — DEXAMETHASONE SODIUM PHOSPHATE 10 MG/ML IJ SOLN
INTRAMUSCULAR | Status: DC | PRN
  Administered 2019-10-07: 10 mg via INTRAVENOUS

## 2019-10-07 MED ORDER — METOPROLOL TARTRATE 5 MG/5ML IV SOLN
INTRAVENOUS | Status: AC
  Filled 2019-10-07: qty 5

## 2019-10-07 MED ORDER — EPINEPHRINE PF 1 MG/ML IJ SOLN
INTRAMUSCULAR | Status: AC
  Filled 2019-10-07: qty 1

## 2019-10-07 MED ORDER — ONDANSETRON 4 MG PO TBDP
4.0000 mg | ORAL_TABLET | Freq: Three times a day (TID) | ORAL | 0 refills | Status: AC | PRN
Start: 2019-10-07 — End: ?

## 2019-10-07 MED ORDER — MIDAZOLAM HCL 2 MG/2ML IJ SOLN
INTRAMUSCULAR | Status: AC
  Filled 2019-10-07: qty 2

## 2019-10-07 MED ORDER — ONDANSETRON HCL 4 MG/2ML IJ SOLN
INTRAMUSCULAR | Status: DC | PRN
  Administered 2019-10-07 (×2): 4 mg via INTRAVENOUS

## 2019-10-07 MED ORDER — DEXMEDETOMIDINE HCL IN NACL 200 MCG/50ML IV SOLN
INTRAVENOUS | Status: DC | PRN
  Administered 2019-10-07 (×3): 8 ug via INTRAVENOUS

## 2019-10-07 MED ORDER — ROCURONIUM BROMIDE 100 MG/10ML IV SOLN
INTRAVENOUS | Status: DC | PRN
  Administered 2019-10-07: 10 mg via INTRAVENOUS
  Administered 2019-10-07: 30 mg via INTRAVENOUS

## 2019-10-07 MED ORDER — CHLORHEXIDINE GLUCONATE 0.12 % MT SOLN
OROMUCOSAL | Status: AC
  Administered 2019-10-07: 15 mL via OROMUCOSAL
  Filled 2019-10-07: qty 15

## 2019-10-07 MED ORDER — SUGAMMADEX SODIUM 500 MG/5ML IV SOLN
INTRAVENOUS | Status: DC | PRN
  Administered 2019-10-07: 100 mg via INTRAVENOUS
  Administered 2019-10-07: 550 mg via INTRAVENOUS

## 2019-10-07 MED ORDER — DEXMEDETOMIDINE HCL IN NACL 80 MCG/20ML IV SOLN
INTRAVENOUS | Status: AC
  Filled 2019-10-07: qty 20

## 2019-10-07 SURGICAL SUPPLY — 46 items
ADAPTER IRRIG TUBE 2 SPIKE SOL (ADAPTER) ×4 IMPLANT
BLADE SURG SZ11 CARB STEEL (BLADE) ×2 IMPLANT
BNDG COHESIVE 6X5 TAN STRL LF (GAUZE/BANDAGES/DRESSINGS) ×2 IMPLANT
BNDG ELASTIC 6X5.8 VLCR STR LF (GAUZE/BANDAGES/DRESSINGS) ×2 IMPLANT
BNDG ESMARK 6X12 TAN STRL LF (GAUZE/BANDAGES/DRESSINGS) ×2 IMPLANT
BUR RADIUS 3.5 (BURR) ×1 IMPLANT
BUR RADIUS 4.0X18.5 (BURR) IMPLANT
CAST PADDING 6X4YD ST 30248 (SOFTGOODS) ×1
CHLORAPREP W/TINT 26 (MISCELLANEOUS) ×2 IMPLANT
COOLER POLAR GLACIER W/PUMP (MISCELLANEOUS) ×1 IMPLANT
COVER WAND RF STERILE (DRAPES) ×2 IMPLANT
CUFF TOURN SGL QUICK 24 (TOURNIQUET CUFF) ×1
CUFF TOURN SGL QUICK 30 (TOURNIQUET CUFF)
CUFF TRNQT CYL 24X4X16.5-23 (TOURNIQUET CUFF) IMPLANT
CUFF TRNQT CYL 30X4X21-28X (TOURNIQUET CUFF) IMPLANT
DEVICE SUCT BLK HOLE OR FLOOR (MISCELLANEOUS) ×2 IMPLANT
DRAPE IMP U-DRAPE 54X76 (DRAPES) ×2 IMPLANT
ELECT REM PT RETURN 9FT ADLT (ELECTROSURGICAL)
ELECTRODE REM PT RTRN 9FT ADLT (ELECTROSURGICAL) IMPLANT
GAUZE SPONGE 4X4 12PLY STRL (GAUZE/BANDAGES/DRESSINGS) ×2 IMPLANT
GLOVE BIOGEL PI IND STRL 8 (GLOVE) ×1 IMPLANT
GLOVE BIOGEL PI INDICATOR 8 (GLOVE) ×1
GLOVE SURG ORTHO 8.0 STRL STRW (GLOVE) ×4 IMPLANT
GOWN STRL REUS W/ TWL LRG LVL3 (GOWN DISPOSABLE) ×1 IMPLANT
GOWN STRL REUS W/ TWL XL LVL3 (GOWN DISPOSABLE) ×1 IMPLANT
GOWN STRL REUS W/TWL LRG LVL3 (GOWN DISPOSABLE) ×1
GOWN STRL REUS W/TWL XL LVL3 (GOWN DISPOSABLE) ×1
IV LACTATED RINGER IRRG 3000ML (IV SOLUTION) ×4
IV LR IRRIG 3000ML ARTHROMATIC (IV SOLUTION) ×4 IMPLANT
KIT TURNOVER KIT A (KITS) ×2 IMPLANT
MANIFOLD NEPTUNE II (INSTRUMENTS) ×2 IMPLANT
MAT ABSORB  FLUID 56X50 GRAY (MISCELLANEOUS) ×2
MAT ABSORB FLUID 56X50 GRAY (MISCELLANEOUS) ×2 IMPLANT
PACK KNEE ARTHRO (MISCELLANEOUS) ×2 IMPLANT
PAD ABD DERMACEA PRESS 5X9 (GAUZE/BANDAGES/DRESSINGS) ×4 IMPLANT
PAD WRAPON POLAR KNEE (MISCELLANEOUS) ×1 IMPLANT
PADDING CAST COTTON 6X4 ST (SOFTGOODS) ×1 IMPLANT
PENCIL ELECTRO HAND CTR (MISCELLANEOUS) IMPLANT
SET TUBE SUCT SHAVER OUTFL 24K (TUBING) ×2 IMPLANT
SET TUBE TIP INTRA-ARTICULAR (MISCELLANEOUS) ×2 IMPLANT
SUT ETHILON 3-0 FS-10 30 BLK (SUTURE) ×2
SUTURE EHLN 3-0 FS-10 30 BLK (SUTURE) ×1 IMPLANT
TOWEL OR 17X26 4PK STRL BLUE (TOWEL DISPOSABLE) ×4 IMPLANT
TUBING ARTHRO INFLOW-ONLY STRL (TUBING) ×2 IMPLANT
WAND WEREWOLF FLOW 90D (MISCELLANEOUS) IMPLANT
WRAPON POLAR PAD KNEE (MISCELLANEOUS)

## 2019-10-07 NOTE — Op Note (Signed)
Operative Note    SURGERY DATE: 10/07/2019   PRE-OP DIAGNOSIS:  1. Right medial meniscus tear  POST-OP DIAGNOSIS:  1. Right medial meniscus tear 2. Right mild tricompartmental degenerative changes 3. Right knee medial plica band   PROCEDURES:  1. Right knee arthroscopy, partial medial meniscectomy 2. Right knee chondroplasty of medial and lateral compartments 3. Right knee partial synovectomy with medial plica band excision   SURGEON: Cato Mulligan, MD   ANESTHESIA: Gen   ESTIMATED BLOOD LOSS: minimal   TOTAL IV FLUIDS: per anesthesia   INDICATION(S):  Brendan Larson is a 64 y.o. male with signs and symptoms as well as MRI finding of medial meniscus tear. After discussion of risks, benefits, and alternatives to surgery, the patient elected to proceed.   OPERATIVE FINDINGS:    Examination under anesthesia: A careful examination under anesthesia was performed.  Passive range of motion was: Hyperextension: 1.  Extension: 0.  Flexion: 130.  Lachman: normal. Pivot Shift: normal.  Posterior drawer: normal.  Varus stability in full extension: normal.  Varus stability in 30 degrees of flexion: normal.  Valgus stability in full extension: normal.  Valgus stability in 30 degrees of flexion: normal.   Intra-operative findings: A thorough arthroscopic examination of the knee was performed.  The findings are: 1. Suprapatellar pouch: Normal 2. Undersurface of median ridge: Focal area of grade 2 changes 3. Medial patellar facet: Grade 1 softening 4. Lateral patellar facet: Grade 1 softening 5. Trochlea: small area of central grade 2 changes 6. Lateral gutter/popliteus tendon: Normal 7. Hoffa's fat pad: Inflamed 8. Medial gutter/plica: Prominent medial plica 9. ACL: Normal 10. PCL: Normal 11. Medial meniscus: Complex tear with horizontal tear pattern of the posterior horn with parrot beak component of the posterior horn/body junction extending to the body itself. There was also a flipped  fragment of the meniscus into the tibial recess 12. Medial compartment cartilage: Grade 2 degenerative changes to the medial femoral condyle and tibial plateau 13. Lateral meniscus: Normal 14. Lateral compartment cartilage: Focal Grade 2 degenerative changes to the lateral femoral condyle, grade 1 changes to the tibial plateau   OPERATIVE REPORT:     I identified Salome Spotted in the pre-operative holding area. I marked the operative knee with my initials. I reviewed the risks and benefits of the proposed surgical intervention and the patient wished to proceed. The patient was transferred to the operative suite and placed in the supine position with all bony prominences padded.  Anesthesia was administered. Appropriate IV antibiotics were administered prior to incision. The extremity was then prepped and draped in standard fashion. A time out was performed confirming the correct extremity, correct patient, and correct procedure.   Arthroscopy portals were marked. Local anesthetic was injected to the planned portal sites. The anterolateral portal was established with an 11 blade.      The arthroscope was placed in the anterolateral portal and then into the suprapatellar pouch. Next, the medial portal was established under needle localization. A diagnostic knee scope was completed with the above findings. The medial meniscus tear was identified.   The MCL was pie-crusted to improve visualization of the posterior horn. The meniscal tear was debrided using an arthroscopic biter and an oscillating shaver until the meniscus had stable borders. A chondroplasty was performed of the medial compartment and lateral compartment such that there were stable cartilage edges without any loose fragments of cartilage. partial synovectomy to excise medial plica band was performed with an oscillating shaver. Arthroscopic fluid was  removed from the joint.   The portals were closed with 3-0 Nylon suture. Sterile dressings  included Xeroform, 4x4s, Sof-Rol, and Bias wrap. A Polarcare was placed.  The patient was then awakened and taken to the PACU hemodynamically stable without complication.     POSTOPERATIVE PLAN: The patient will be discharged home today once they meet PACU criteria. Aspirin 325 mg daily was prescribed for 2 weeks for DVT prophylaxis.  Physical therapy will start on POD#3-4. Weight-bearing as tolerated. Follow up in 2 weeks per protocol.

## 2019-10-07 NOTE — Transfer of Care (Signed)
Immediate Anesthesia Transfer of Care Note  Patient: Brendan Larson  Procedure(s) Performed: RIGHT KNEE ARTHROSCOPIC PARTIAL MEDIAL MENISECTOMY (Right Knee)  Patient Location: PACU  Anesthesia Type:General  Level of Consciousness: awake, drowsy and responds to stimulation  Airway & Oxygen Therapy: Patient Spontanous Breathing and Patient connected to face mask oxygen  Post-op Assessment: Report given to RN and Post -op Vital signs reviewed and stable  Post vital signs: Reviewed and stable  Last Vitals:  Vitals Value Taken Time  BP 107/59 10/07/19 1327  Temp 36.2 C 10/07/19 1327  Pulse 72 10/07/19 1333  Resp 12 10/07/19 1333  SpO2 100 % 10/07/19 1333  Vitals shown include unvalidated device data.  Last Pain:  Vitals:   10/07/19 1327  TempSrc:   PainSc: Asleep         Complications: No apparent anesthesia complications

## 2019-10-07 NOTE — Anesthesia Procedure Notes (Signed)
Procedure Name: Intubation Performed by: Kelton Pillar, CRNA Pre-anesthesia Checklist: Patient identified, Emergency Drugs available, Suction available and Patient being monitored Patient Re-evaluated:Patient Re-evaluated prior to induction Oxygen Delivery Method: Circle system utilized Preoxygenation: Pre-oxygenation with 100% oxygen Induction Type: IV induction Ventilation: Mask ventilation without difficulty Laryngoscope Size: McGraph and 3 Grade View: Grade I Tube type: Oral Tube size: 7.0 mm Number of attempts: 1 Airway Equipment and Method: Stylet and Oral airway Placement Confirmation: ETT inserted through vocal cords under direct vision,  positive ETCO2 and breath sounds checked- equal and bilateral Secured at: 22 cm Tube secured with: Tape Dental Injury: Teeth and Oropharynx as per pre-operative assessment

## 2019-10-07 NOTE — H&P (Signed)
Paper H&P to be scanned into permanent record. H&P reviewed. No significant changes noted.  

## 2019-10-07 NOTE — Anesthesia Postprocedure Evaluation (Signed)
Anesthesia Post Note  Patient: [facility]  Procedure(s) Performed: RIGHT KNEE ARTHROSCOPIC PARTIAL MEDIAL MENISECTOMY (Right Knee)  Patient location during evaluation: PACU Anesthesia Type: General Level of consciousness: awake and alert Pain management: pain level controlled Vital Signs Assessment: post-procedure vital signs reviewed and stable Respiratory status: spontaneous breathing, nonlabored ventilation, respiratory function stable and patient connected to nasal cannula oxygen Cardiovascular status: blood pressure returned to baseline and stable Postop Assessment: no apparent nausea or vomiting Anesthetic complications: no     Last Vitals:  Vitals:   10/07/19 1412 10/07/19 1416  BP: (!) 104/58   Pulse: 65 68  Resp: 14 15  Temp:    SpO2: 99% 100%    Last Pain:  Vitals:   10/07/19 1430  TempSrc:   PainSc: 6                  Marcheta Horsey K Elsie Sakuma

## 2019-10-07 NOTE — Anesthesia Preprocedure Evaluation (Signed)
Anesthesia Evaluation  Patient identified by MRN, date of birth, ID band Patient awake    Reviewed: Allergy & Precautions, H&P , NPO status , Patient's Chart, lab work & pertinent test results  History of Anesthesia Complications Negative for: history of anesthetic complications  Airway Mallampati: III  TM Distance: <3 FB Neck ROM: limited    Dental  (+) Chipped, Poor Dentition   Pulmonary neg shortness of breath, asthma , sleep apnea , Current Smoker,    Pulmonary exam normal        Cardiovascular Exercise Tolerance: Good hypertension, (-) angina+ Past MI  (-) DOE Normal cardiovascular exam+ Valvular Problems/Murmurs      Neuro/Psych PSYCHIATRIC DISORDERS negative neurological ROS     GI/Hepatic Neg liver ROS, GERD  Medicated and Controlled,  Endo/Other  negative endocrine ROS  Renal/GU      Musculoskeletal   Abdominal   Peds  Hematology negative hematology ROS (+)   Anesthesia Other Findings Past Medical History: No date: Anemia No date: Arthritis No date: Asthma     Comment:  h/o 2019: Cancer ([place])     Comment:  PROSTATE WITH METS TO BONE-STAGE 4B 07/28/2017: Chronic left shoulder pain No date: Depression No date: GERD (gastroesophageal reflux disease) No date: Heart murmur     Comment:  Mitral Valve Prolapse - asymptomatic 07/28/2017: Hip pain, chronic, right No date: Hyperlipidemia No date: Hypertension No date: Motion sickness     Comment:  small ocean vessels No date: Myocardial infarction ([facility]CC)     Comment:  PT UNSURE IF HE HAS HAD MI-TOLD IN THE PAST HE HAD ONE               PER EKG BUT HAS NEVER SEEN CARDIOLOGIST FOR MI No date: Pre-diabetes No date: Sleep apnea     Comment:  mild.  No CPAP No date: Tuberculosis     Comment:  had in 1990s.  Treated.  Past Surgical History: No date: CLAVICLE EXCISION; Right 09/21/2017: COLONOSCOPY WITH PROPOFOL; N/A     Comment:  Procedure: COLONOSCOPY  WITH PROPOFOL;  Surgeon:               Lollie Sails, MD;  Location: [facility] ENDOSCOPY;                Service: Endoscopy;  Laterality: N/A; 09/21/2017: ESOPHAGOGASTRODUODENOSCOPY (EGD) WITH PROPOFOL; N/A     Comment:  Procedure: ESOPHAGOGASTRODUODENOSCOPY (EGD) WITH               PROPOFOL;  Surgeon: Lollie Sails, MD;  Location:               [facility] ENDOSCOPY;  Service: Endoscopy;  Laterality: N/A; 09/28/2018: KNEE ARTHROSCOPY WITH MEDIAL MENISECTOMY; Left     Comment:  Procedure: KNEE ARTHROSCOPY WITH PARTIAL  MEDIAL               MENISECTOMY;  Surgeon: Leim Fabry, MD;  Location:               [place];  Service: Orthopedics;  Laterality:               Left;  sleep apnea No date: TONSILECTOMY, ADENOIDECTOMY, BILATERAL MYRINGOTOMY AND TUBES No date: TONSILLECTOMY  BMI    Body Mass Index: 28.37 kg/m      Reproductive/Obstetrics negative OB ROS  Anesthesia Physical Anesthesia Plan  ASA: III  Anesthesia Plan: General ETT   Post-op Pain Management:    Induction: Intravenous  PONV Risk Score and Plan: Ondansetron, Dexamethasone, Midazolam and Treatment may vary due to age or medical condition  Airway Management Planned: Oral ETT  Additional Equipment:   Intra-op Plan:   Post-operative Plan: Extubation in OR  Informed Consent: I have reviewed the patients History and Physical, chart, labs and discussed the procedure including the risks, benefits and alternatives for the proposed anesthesia with the patient or authorized representative who has indicated his/her understanding and acceptance.     Dental Advisory Given  Plan Discussed with: Anesthesiologist, CRNA and Surgeon  Anesthesia Plan Comments: (Patient consented for risks of anesthesia including but not limited to:  - adverse reactions to medications - damage to eyes, teeth, lips or other oral mucosa - nerve damage due to positioning  - sore  throat or hoarseness - Damage to heart, brain, nerves, lungs, other parts of body or loss of life  Patient voiced understanding.)        Anesthesia Quick Evaluation

## 2019-10-07 NOTE — Discharge Instructions (Signed)
Arthroscopic Knee Surgery - Partial Meniscectomy   Post-Op Instructions   1. Bracing or crutches: Crutches will be provided at the time of discharge from the surgery center if you do not already have them.   2. Ice: You may be provided with a device (Polar Care) that allows you to ice the affected area effectively. Otherwise you can ice manually.    3. Driving:  Plan on not driving for at least two weeks. Please note that you are advised NOT to drive while taking narcotic pain medications as you may be impaired and unsafe to drive.   4. Activity: Ankle pumps several times an hour while awake to prevent blood clots. Weight bearing: as tolerated. Use crutches for as needed (usually ~1 week or less) until pain allows you to ambulate without a limp. Bending and straightening the knee is unlimited. Elevate knee above heart level as much as possible for one week. Avoid standing more than 5 minutes (consecutively) for the first week.  Avoid long distance travel for 2 weeks.  5. Medications:  - You have been provided a prescription for narcotic pain medicine. After surgery, take 1-2 narcotic tablets every 4 hours if needed for severe pain.  - You may take up to 3000mg/day of tylenol (acetaminophen). You can take 1000mg 3x/day. Please check your narcotic. If you have acetaminophen in your narcotic (each tablet will be 325mg), be careful not to exceed a total of 3000mg/day of acetaminophen.  - A prescription for anti-nausea medication will be provided in case the narcotic medicine or anesthesia causes nausea - take 1 tablet every 6 hours only if nauseated.  - Take ibuprofen 800 mg every 8 hours WITH food to reduce post-operative knee swelling. DO NOT STOP IBUPROFEN POST-OP UNTIL INSTRUCTED TO DO SO at first post-op office visit (10-14 days after surgery). However, please discontinue if you have any abdominal discomfort after taking this.  - Take enteric coated aspirin 325 mg once daily for 2 weeks to prevent  blood clots.    6. Bandages: The physical therapist should change the bandages at the first post-op appointment. If needed, the dressing supplies have been provided to you.   7. Physical Therapy: 1-2 times per week for 6 weeks. Therapy typically starts on post operative Day 3 or 4. You have been provided an order for physical therapy. The therapist will provide home exercises.   8. Work: May return to full work usually around 2 weeks after 1st post-operative visit. May do light duty/desk job in approximately 1-2 weeks when off of narcotics, pain is well-controlled, and swelling has decreased. Labor intensive jobs may require 4-6 weeks to return.      9. Post-Op Appointments: Your first post-op appointment will be with Dr. Margree Gimbel in approximately 2 weeks time.    If you find that they have not been scheduled please call the Orthopaedic Appointment front desk at [phone].  

## 2019-10-14 ENCOUNTER — Ambulatory Visit: Admit: 2019-10-14 | Discharge: 2019-10-15 | Payer: MEDICARE

## 2019-10-14 ENCOUNTER — Other Ambulatory Visit: Admit: 2019-10-14 | Discharge: 2019-10-15 | Payer: MEDICARE

## 2019-10-14 ENCOUNTER — Ambulatory Visit: Admit: 2019-10-14 | Discharge: 2019-10-14 | Payer: MEDICARE | Attending: Family | Primary: Family

## 2019-10-14 DIAGNOSIS — C7951 Secondary malignant neoplasm of bone: Principal | ICD-10-CM

## 2019-10-14 DIAGNOSIS — C61 Malignant neoplasm of prostate: Principal | ICD-10-CM

## 2019-10-15 ENCOUNTER — Institutional Professional Consult (permissible substitution): Admit: 2019-10-15 | Discharge: 2019-10-16 | Payer: MEDICARE

## 2019-10-15 DIAGNOSIS — C61 Malignant neoplasm of prostate: Principal | ICD-10-CM

## 2019-10-15 DIAGNOSIS — C7951 Secondary malignant neoplasm of bone: Secondary | ICD-10-CM

## 2020-01-30 ENCOUNTER — Ambulatory Visit: Admit: 2020-01-30 | Discharge: 2020-01-31 | Payer: MEDICARE

## 2020-01-30 ENCOUNTER — Ambulatory Visit: Admit: 2020-01-30 | Discharge: 2020-01-31 | Payer: MEDICARE | Attending: Family | Primary: Family

## 2020-01-30 ENCOUNTER — Institutional Professional Consult (permissible substitution): Admit: 2020-01-30 | Discharge: 2020-01-30 | Payer: MEDICARE

## 2020-01-30 ENCOUNTER — Other Ambulatory Visit: Admit: 2020-01-30 | Discharge: 2020-01-31 | Payer: MEDICARE

## 2020-01-30 DIAGNOSIS — C61 Malignant neoplasm of prostate: Principal | ICD-10-CM

## 2020-01-30 DIAGNOSIS — C7951 Secondary malignant neoplasm of bone: Secondary | ICD-10-CM

## 2020-05-07 ENCOUNTER — Other Ambulatory Visit: Admit: 2020-05-07 | Discharge: 2020-05-08 | Payer: MEDICARE

## 2020-05-07 ENCOUNTER — Ambulatory Visit: Admit: 2020-05-07 | Discharge: 2020-05-08 | Payer: MEDICARE | Attending: Family | Primary: Family

## 2020-05-07 DIAGNOSIS — Z880 Allergy status to penicillin: Principal | ICD-10-CM

## 2020-05-07 DIAGNOSIS — R972 Elevated prostate specific antigen [PSA]: Principal | ICD-10-CM

## 2020-05-07 DIAGNOSIS — R110 Nausea: Principal | ICD-10-CM

## 2020-05-07 DIAGNOSIS — I10 Essential (primary) hypertension: Principal | ICD-10-CM

## 2020-05-07 DIAGNOSIS — C61 Malignant neoplasm of prostate: Principal | ICD-10-CM

## 2020-05-07 DIAGNOSIS — L509 Urticaria, unspecified: Principal | ICD-10-CM

## 2020-05-07 DIAGNOSIS — Z8042 Family history of malignant neoplasm of prostate: Principal | ICD-10-CM

## 2020-05-07 DIAGNOSIS — R350 Frequency of micturition: Principal | ICD-10-CM

## 2020-05-07 DIAGNOSIS — Z803 Family history of malignant neoplasm of breast: Principal | ICD-10-CM

## 2020-05-07 DIAGNOSIS — C7951 Secondary malignant neoplasm of bone: Principal | ICD-10-CM

## 2020-05-07 DIAGNOSIS — Z800 Family history of malignant neoplasm of digestive organs: Principal | ICD-10-CM

## 2020-05-07 DIAGNOSIS — Z9221 Personal history of antineoplastic chemotherapy: Principal | ICD-10-CM

## 2020-05-07 DIAGNOSIS — Z808 Family history of malignant neoplasm of other organs or systems: Principal | ICD-10-CM

## 2020-05-07 DIAGNOSIS — E7800 Pure hypercholesterolemia, unspecified: Principal | ICD-10-CM

## 2020-05-07 DIAGNOSIS — S83207A Unspecified tear of unspecified meniscus, current injury, left knee, initial encounter: Principal | ICD-10-CM

## 2020-07-27 DIAGNOSIS — C61 Malignant neoplasm of prostate: Principal | ICD-10-CM

## 2020-07-27 DIAGNOSIS — C7951 Secondary malignant neoplasm of bone: Principal | ICD-10-CM

## 2020-12-13 DIAGNOSIS — C61 Malignant neoplasm of prostate: Principal | ICD-10-CM

## 2020-12-13 DIAGNOSIS — C7951 Secondary malignant neoplasm of bone: Principal | ICD-10-CM

## 2020-12-14 ENCOUNTER — Ambulatory Visit: Admit: 2020-12-14 | Discharge: 2020-12-15 | Payer: MEDICARE | Attending: Internal Medicine | Primary: Internal Medicine

## 2020-12-14 DIAGNOSIS — Z192 Hormone resistant malignancy status: Principal | ICD-10-CM

## 2020-12-14 DIAGNOSIS — C7951 Secondary malignant neoplasm of bone: Principal | ICD-10-CM

## 2020-12-14 DIAGNOSIS — C61 Malignant neoplasm of prostate: Principal | ICD-10-CM

## 2020-12-28 ENCOUNTER — Ambulatory Visit: Admit: 2020-12-28 | Discharge: 2020-12-29 | Payer: MEDICARE

## 2021-01-06 DIAGNOSIS — Z192 Hormone resistant malignancy status: Principal | ICD-10-CM

## 2021-01-06 DIAGNOSIS — C61 Malignant neoplasm of prostate: Principal | ICD-10-CM

## 2021-01-07 ENCOUNTER — Ambulatory Visit: Admit: 2021-01-07 | Discharge: 2021-01-08 | Payer: MEDICARE

## 2021-01-11 ENCOUNTER — Other Ambulatory Visit: Admit: 2021-01-11 | Discharge: 2021-01-12 | Payer: MEDICARE

## 2021-01-11 ENCOUNTER — Ambulatory Visit: Admit: 2021-01-11 | Discharge: 2021-01-12 | Payer: MEDICARE | Attending: Internal Medicine | Primary: Internal Medicine

## 2021-01-11 DIAGNOSIS — Z192 Hormone resistant malignancy status: Principal | ICD-10-CM

## 2021-01-11 DIAGNOSIS — C61 Malignant neoplasm of prostate: Principal | ICD-10-CM

## 2021-01-11 DIAGNOSIS — C7951 Secondary malignant neoplasm of bone: Principal | ICD-10-CM

## 2021-01-11 MED ORDER — DAROLUTAMIDE 300 MG TABLET
ORAL_TABLET | Freq: Two times a day (BID) | ORAL | 11 refills | 30 days | Status: CP
Start: 2021-01-11 — End: ?

## 2021-01-12 DIAGNOSIS — M85859 Other specified disorders of bone density and structure, unspecified thigh: Principal | ICD-10-CM

## 2021-01-12 DIAGNOSIS — Z192 Hormone resistant malignancy status: Principal | ICD-10-CM

## 2021-01-12 DIAGNOSIS — C7951 Secondary malignant neoplasm of bone: Principal | ICD-10-CM

## 2021-01-12 DIAGNOSIS — C61 Malignant neoplasm of prostate: Principal | ICD-10-CM

## 2021-02-08 ENCOUNTER — Other Ambulatory Visit: Admit: 2021-02-08 | Discharge: 2021-02-09 | Payer: MEDICARE

## 2021-02-08 ENCOUNTER — Ambulatory Visit: Admit: 2021-02-08 | Discharge: 2021-02-09 | Payer: MEDICARE | Attending: Internal Medicine | Primary: Internal Medicine

## 2021-02-08 ENCOUNTER — Institutional Professional Consult (permissible substitution): Admit: 2021-02-08 | Discharge: 2021-02-09 | Payer: MEDICARE

## 2021-02-08 ENCOUNTER — Ambulatory Visit: Admit: 2021-02-08 | Discharge: 2021-02-09 | Payer: MEDICARE

## 2021-02-08 DIAGNOSIS — C61 Malignant neoplasm of prostate: Principal | ICD-10-CM

## 2021-02-08 DIAGNOSIS — C7951 Secondary malignant neoplasm of bone: Principal | ICD-10-CM

## 2021-02-08 DIAGNOSIS — Z192 Hormone resistant malignancy status: Principal | ICD-10-CM

## 2021-02-08 DIAGNOSIS — M85859 Other specified disorders of bone density and structure, unspecified thigh: Principal | ICD-10-CM

## 2021-03-22 ENCOUNTER — Ambulatory Visit: Admit: 2021-03-22 | Discharge: 2021-03-23 | Payer: MEDICARE | Attending: Family | Primary: Family

## 2021-03-22 ENCOUNTER — Other Ambulatory Visit: Admit: 2021-03-22 | Discharge: 2021-03-23 | Payer: MEDICARE

## 2021-03-22 DIAGNOSIS — C61 Malignant neoplasm of prostate: Principal | ICD-10-CM

## 2021-03-22 DIAGNOSIS — Z192 Hormone resistant malignancy status: Principal | ICD-10-CM

## 2021-03-22 MED ORDER — DAROLUTAMIDE 300 MG TABLET
ORAL_TABLET | Freq: Two times a day (BID) | ORAL | 11 refills | 30 days | Status: CP
Start: 2021-03-22 — End: ?

## 2021-05-07 DIAGNOSIS — C61 Malignant neoplasm of prostate: Principal | ICD-10-CM

## 2021-05-07 DIAGNOSIS — Z192 Hormone resistant malignancy status: Principal | ICD-10-CM

## 2021-05-07 MED ORDER — DAROLUTAMIDE 300 MG TABLET
ORAL_TABLET | Freq: Two times a day (BID) | ORAL | 11 refills | 30 days | Status: CP
Start: 2021-05-07 — End: ?

## 2021-05-09 DIAGNOSIS — C7951 Secondary malignant neoplasm of bone: Principal | ICD-10-CM

## 2021-05-09 DIAGNOSIS — C61 Malignant neoplasm of prostate: Principal | ICD-10-CM

## 2021-05-10 DIAGNOSIS — Z192 Hormone resistant malignancy status: Principal | ICD-10-CM

## 2021-05-10 DIAGNOSIS — C61 Malignant neoplasm of prostate: Principal | ICD-10-CM

## 2021-05-10 MED ORDER — DAROLUTAMIDE 300 MG TABLET
ORAL_TABLET | Freq: Two times a day (BID) | ORAL | 11 refills | 30 days | Status: CP
Start: 2021-05-10 — End: ?

## 2021-05-13 ENCOUNTER — Other Ambulatory Visit: Admit: 2021-05-13 | Discharge: 2021-05-13 | Payer: MEDICARE

## 2021-05-13 ENCOUNTER — Ambulatory Visit: Admit: 2021-05-13 | Discharge: 2021-05-13 | Payer: MEDICARE | Attending: Internal Medicine | Primary: Internal Medicine

## 2021-05-13 ENCOUNTER — Institutional Professional Consult (permissible substitution): Admit: 2021-05-13 | Discharge: 2021-05-13 | Payer: MEDICARE

## 2021-05-13 ENCOUNTER — Ambulatory Visit: Admit: 2021-05-13 | Discharge: 2021-05-13 | Payer: MEDICARE

## 2021-05-13 DIAGNOSIS — C7951 Secondary malignant neoplasm of bone: Principal | ICD-10-CM

## 2021-05-13 DIAGNOSIS — C61 Malignant neoplasm of prostate: Principal | ICD-10-CM

## 2021-05-13 DIAGNOSIS — Z192 Hormone resistant malignancy status: Principal | ICD-10-CM

## 2021-05-13 DIAGNOSIS — M85859 Other specified disorders of bone density and structure, unspecified thigh: Principal | ICD-10-CM

## 2021-05-18 DIAGNOSIS — M85859 Other specified disorders of bone density and structure, unspecified thigh: Principal | ICD-10-CM

## 2021-05-18 DIAGNOSIS — C7951 Secondary malignant neoplasm of bone: Principal | ICD-10-CM

## 2021-05-18 DIAGNOSIS — C61 Malignant neoplasm of prostate: Principal | ICD-10-CM

## 2021-08-09 DIAGNOSIS — C7951 Secondary malignant neoplasm of bone: Principal | ICD-10-CM

## 2021-08-09 DIAGNOSIS — M85859 Other specified disorders of bone density and structure, unspecified thigh: Principal | ICD-10-CM

## 2021-08-09 DIAGNOSIS — C61 Malignant neoplasm of prostate: Principal | ICD-10-CM

## 2021-08-12 ENCOUNTER — Other Ambulatory Visit: Admit: 2021-08-12 | Discharge: 2021-08-12 | Payer: MEDICARE

## 2021-08-12 ENCOUNTER — Ambulatory Visit: Admit: 2021-08-12 | Discharge: 2021-08-12 | Payer: MEDICARE | Attending: Family | Primary: Family

## 2021-08-12 ENCOUNTER — Ambulatory Visit: Admit: 2021-08-12 | Discharge: 2021-08-12 | Payer: MEDICARE

## 2021-08-12 DIAGNOSIS — C7951 Secondary malignant neoplasm of bone: Principal | ICD-10-CM

## 2021-08-12 DIAGNOSIS — M85859 Other specified disorders of bone density and structure, unspecified thigh: Principal | ICD-10-CM

## 2021-08-12 DIAGNOSIS — C61 Malignant neoplasm of prostate: Principal | ICD-10-CM

## 2021-08-12 DIAGNOSIS — Z192 Hormone resistant malignancy status: Principal | ICD-10-CM

## 2021-11-08 DIAGNOSIS — C61 Malignant neoplasm of prostate: Principal | ICD-10-CM

## 2021-11-08 DIAGNOSIS — M85859 Other specified disorders of bone density and structure, unspecified thigh: Principal | ICD-10-CM

## 2021-11-08 DIAGNOSIS — C7951 Secondary malignant neoplasm of bone: Principal | ICD-10-CM

## 2021-11-10 DIAGNOSIS — C61 Malignant neoplasm of prostate: Principal | ICD-10-CM

## 2021-11-11 ENCOUNTER — Ambulatory Visit: Admit: 2021-11-11 | Discharge: 2021-11-11 | Payer: MEDICARE | Attending: Family | Primary: Family

## 2021-11-11 ENCOUNTER — Institutional Professional Consult (permissible substitution): Admit: 2021-11-11 | Discharge: 2021-11-11 | Payer: MEDICARE

## 2021-11-11 ENCOUNTER — Other Ambulatory Visit: Admit: 2021-11-11 | Discharge: 2021-11-11 | Payer: MEDICARE

## 2021-11-11 DIAGNOSIS — C61 Malignant neoplasm of prostate: Principal | ICD-10-CM

## 2021-11-11 DIAGNOSIS — Z192 Hormone resistant malignancy status: Principal | ICD-10-CM

## 2021-11-11 DIAGNOSIS — C7951 Secondary malignant neoplasm of bone: Principal | ICD-10-CM

## 2021-11-11 DIAGNOSIS — M85859 Other specified disorders of bone density and structure, unspecified thigh: Principal | ICD-10-CM

## 2021-11-11 MED ORDER — DAROLUTAMIDE 300 MG TABLET
ORAL_TABLET | Freq: Two times a day (BID) | ORAL | 11 refills | 30.00000 days | Status: CP
Start: 2021-11-11 — End: 2021-11-11

## 2022-02-08 DIAGNOSIS — M85859 Other specified disorders of bone density and structure, unspecified thigh: Principal | ICD-10-CM

## 2022-02-08 DIAGNOSIS — C7951 Secondary malignant neoplasm of bone: Principal | ICD-10-CM

## 2022-02-08 DIAGNOSIS — C61 Malignant neoplasm of prostate: Principal | ICD-10-CM

## 2022-02-09 DIAGNOSIS — C61 Malignant neoplasm of prostate: Principal | ICD-10-CM

## 2022-02-09 DIAGNOSIS — C7951 Secondary malignant neoplasm of bone: Principal | ICD-10-CM

## 2022-02-10 ENCOUNTER — Ambulatory Visit: Admit: 2022-02-10 | Discharge: 2022-02-10 | Payer: MEDICARE

## 2022-02-10 ENCOUNTER — Other Ambulatory Visit: Admit: 2022-02-10 | Discharge: 2022-02-10 | Payer: MEDICARE

## 2022-02-10 ENCOUNTER — Ambulatory Visit: Admit: 2022-02-10 | Discharge: 2022-02-10 | Payer: MEDICARE | Attending: Internal Medicine | Primary: Internal Medicine

## 2022-02-10 ENCOUNTER — Institutional Professional Consult (permissible substitution): Admit: 2022-02-10 | Discharge: 2022-02-10 | Payer: MEDICARE

## 2022-02-10 DIAGNOSIS — M85859 Other specified disorders of bone density and structure, unspecified thigh: Principal | ICD-10-CM

## 2022-02-10 DIAGNOSIS — C61 Malignant neoplasm of prostate: Principal | ICD-10-CM

## 2022-02-10 DIAGNOSIS — C7951 Secondary malignant neoplasm of bone: Principal | ICD-10-CM

## 2022-03-08 DIAGNOSIS — Z192 Hormone resistant malignancy status: Principal | ICD-10-CM

## 2022-03-08 DIAGNOSIS — C61 Malignant neoplasm of prostate: Principal | ICD-10-CM

## 2022-05-11 DIAGNOSIS — M85859 Other specified disorders of bone density and structure, unspecified thigh: Principal | ICD-10-CM

## 2022-05-11 DIAGNOSIS — C7951 Secondary malignant neoplasm of bone: Principal | ICD-10-CM

## 2022-05-11 DIAGNOSIS — C61 Malignant neoplasm of prostate: Principal | ICD-10-CM

## 2022-05-19 ENCOUNTER — Ambulatory Visit: Admit: 2022-05-19 | Discharge: 2022-05-19 | Payer: MEDICARE

## 2022-05-19 ENCOUNTER — Other Ambulatory Visit: Admit: 2022-05-19 | Discharge: 2022-05-19 | Payer: MEDICARE

## 2022-05-19 ENCOUNTER — Ambulatory Visit: Admit: 2022-05-19 | Discharge: 2022-05-19 | Payer: MEDICARE | Attending: Internal Medicine | Primary: Internal Medicine

## 2022-05-19 DIAGNOSIS — C61 Malignant neoplasm of prostate: Principal | ICD-10-CM

## 2022-05-19 DIAGNOSIS — M85859 Other specified disorders of bone density and structure, unspecified thigh: Principal | ICD-10-CM

## 2022-05-19 DIAGNOSIS — C7951 Secondary malignant neoplasm of bone: Principal | ICD-10-CM

## 2022-05-31 ENCOUNTER — Telehealth: Admit: 2022-05-31 | Discharge: 2022-06-01 | Payer: MEDICARE

## 2022-05-31 DIAGNOSIS — C7951 Secondary malignant neoplasm of bone: Principal | ICD-10-CM

## 2022-05-31 DIAGNOSIS — C61 Malignant neoplasm of prostate: Principal | ICD-10-CM

## 2022-05-31 DIAGNOSIS — R4589 Other symptoms and signs involving emotional state: Principal | ICD-10-CM

## 2022-08-10 DIAGNOSIS — C61 Malignant neoplasm of prostate: Principal | ICD-10-CM

## 2022-08-10 DIAGNOSIS — M85859 Other specified disorders of bone density and structure, unspecified thigh: Principal | ICD-10-CM

## 2022-08-10 DIAGNOSIS — C7951 Secondary malignant neoplasm of bone: Principal | ICD-10-CM

## 2022-08-18 ENCOUNTER — Encounter: Payer: Self-pay | Admitting: *Deleted

## 2022-08-18 ENCOUNTER — Ambulatory Visit: Admit: 2022-08-18 | Discharge: 2022-08-18 | Payer: MEDICARE | Attending: Internal Medicine | Primary: Internal Medicine

## 2022-08-18 ENCOUNTER — Institutional Professional Consult (permissible substitution): Admit: 2022-08-18 | Discharge: 2022-08-18 | Payer: MEDICARE

## 2022-08-18 ENCOUNTER — Ambulatory Visit: Admit: 2022-08-18 | Discharge: 2022-08-18 | Payer: MEDICARE

## 2022-08-18 ENCOUNTER — Other Ambulatory Visit: Admit: 2022-08-18 | Discharge: 2022-08-18 | Payer: MEDICARE

## 2022-08-18 DIAGNOSIS — C61 Malignant neoplasm of prostate: Principal | ICD-10-CM

## 2022-08-18 DIAGNOSIS — C7951 Secondary malignant neoplasm of bone: Principal | ICD-10-CM

## 2022-09-05 ENCOUNTER — Ambulatory Visit: Admit: 2022-09-05 | Discharge: 2022-09-06 | Payer: MEDICARE

## 2022-09-07 DIAGNOSIS — C61 Malignant neoplasm of prostate: Principal | ICD-10-CM

## 2022-09-09 ENCOUNTER — Institutional Professional Consult (permissible substitution): Admit: 2022-09-09 | Discharge: 2022-09-10 | Payer: MEDICARE | Attending: Registered" | Primary: Registered"

## 2022-11-09 DIAGNOSIS — C61 Malignant neoplasm of prostate: Principal | ICD-10-CM

## 2022-11-09 DIAGNOSIS — M85859 Other specified disorders of bone density and structure, unspecified thigh: Principal | ICD-10-CM

## 2022-11-09 DIAGNOSIS — C7951 Secondary malignant neoplasm of bone: Principal | ICD-10-CM

## 2022-11-16 ENCOUNTER — Ambulatory Visit: Admit: 2022-11-16 | Discharge: 2022-11-17 | Payer: MEDICARE

## 2022-11-21 ENCOUNTER — Institutional Professional Consult (permissible substitution): Admit: 2022-11-21 | Discharge: 2022-11-21 | Payer: MEDICARE

## 2022-11-21 ENCOUNTER — Ambulatory Visit: Admit: 2022-11-21 | Discharge: 2022-11-21 | Payer: MEDICARE | Attending: Family | Primary: Family

## 2022-11-21 ENCOUNTER — Ambulatory Visit: Admit: 2022-11-21 | Discharge: 2022-11-21 | Payer: MEDICARE

## 2022-11-21 DIAGNOSIS — C7951 Secondary malignant neoplasm of bone: Principal | ICD-10-CM

## 2022-11-21 DIAGNOSIS — C61 Malignant neoplasm of prostate: Principal | ICD-10-CM

## 2022-11-21 DIAGNOSIS — Z8546 Personal history of malignant neoplasm of prostate: Principal | ICD-10-CM

## 2022-12-28 ENCOUNTER — Telehealth: Payer: Self-pay

## 2022-12-28 NOTE — Telephone Encounter (Addendum)
Per pt he doesn't think this appt is for him. Pt states he knows about the colonoscopy. Per note pt is coming for refill. Pt states he doesn't get any medication from [facility]. Per pt do he need to come on 01-05-2023?  Per pt cancel appt pt thinks it was made in error.  Pt states he didn't call to get a refill Pt to cancel appt.

## 2022-12-28 NOTE — Telephone Encounter (Signed)
PT would like to go back  to [facility] to get colonoscopy.  Pt received letter from [facility] but pt prefer to go back to [facility].

## 2022-12-28 NOTE — Telephone Encounter (Signed)
Noted  

## 2023-01-05 ENCOUNTER — Ambulatory Visit: Payer: Medicare Other | Admitting: Gastroenterology

## 2023-01-31 DIAGNOSIS — Z8546 Personal history of malignant neoplasm of prostate: Principal | ICD-10-CM

## 2023-01-31 DIAGNOSIS — C61 Malignant neoplasm of prostate: Principal | ICD-10-CM

## 2023-01-31 DIAGNOSIS — M85859 Other specified disorders of bone density and structure, unspecified thigh: Principal | ICD-10-CM

## 2023-01-31 DIAGNOSIS — C7951 Secondary malignant neoplasm of bone: Principal | ICD-10-CM

## 2023-02-22 DIAGNOSIS — M85859 Other specified disorders of bone density and structure, unspecified thigh: Principal | ICD-10-CM

## 2023-02-22 DIAGNOSIS — C61 Malignant neoplasm of prostate: Principal | ICD-10-CM

## 2023-02-22 DIAGNOSIS — C7951 Secondary malignant neoplasm of bone: Principal | ICD-10-CM

## 2023-02-23 ENCOUNTER — Ambulatory Visit: Admit: 2023-02-23 | Discharge: 2023-02-24 | Payer: MEDICARE

## 2023-02-23 ENCOUNTER — Institutional Professional Consult (permissible substitution): Admit: 2023-02-23 | Discharge: 2023-02-24 | Payer: MEDICARE

## 2023-02-23 ENCOUNTER — Ambulatory Visit: Admit: 2023-02-23 | Discharge: 2023-02-24 | Payer: MEDICARE | Attending: Internal Medicine | Primary: Internal Medicine

## 2023-02-23 ENCOUNTER — Other Ambulatory Visit: Admit: 2023-02-23 | Discharge: 2023-02-24 | Payer: MEDICARE

## 2023-02-23 DIAGNOSIS — R5383 Other fatigue: Principal | ICD-10-CM

## 2023-02-23 DIAGNOSIS — E119 Type 2 diabetes mellitus without complications: Principal | ICD-10-CM

## 2023-02-23 DIAGNOSIS — M85859 Other specified disorders of bone density and structure, unspecified thigh: Principal | ICD-10-CM

## 2023-02-23 DIAGNOSIS — C61 Malignant neoplasm of prostate: Principal | ICD-10-CM

## 2023-02-23 DIAGNOSIS — C7951 Secondary malignant neoplasm of bone: Principal | ICD-10-CM

## 2023-02-23 DIAGNOSIS — Z8546 Personal history of malignant neoplasm of prostate: Principal | ICD-10-CM

## 2023-03-08 DIAGNOSIS — C61 Malignant neoplasm of prostate: Principal | ICD-10-CM

## 2023-03-08 DIAGNOSIS — Z192 Hormone resistant malignancy status: Principal | ICD-10-CM

## 2023-03-08 MED ORDER — DAROLUTAMIDE 300 MG TABLET
ORAL_TABLET | Freq: Two times a day (BID) | ORAL | 11 refills | 30 days | Status: CP
Start: 2023-03-08 — End: ?

## 2023-03-24 DIAGNOSIS — C61 Malignant neoplasm of prostate: Principal | ICD-10-CM

## 2023-03-24 DIAGNOSIS — C7951 Secondary malignant neoplasm of bone: Principal | ICD-10-CM

## 2023-03-27 DIAGNOSIS — C61 Malignant neoplasm of prostate: Principal | ICD-10-CM

## 2023-03-27 DIAGNOSIS — C7951 Secondary malignant neoplasm of bone: Principal | ICD-10-CM

## 2023-03-30 DIAGNOSIS — C7951 Secondary malignant neoplasm of bone: Principal | ICD-10-CM

## 2023-03-30 DIAGNOSIS — Z192 Hormone resistant malignancy status: Principal | ICD-10-CM

## 2023-03-30 DIAGNOSIS — C61 Malignant neoplasm of prostate: Principal | ICD-10-CM

## 2023-04-05 DIAGNOSIS — C61 Malignant neoplasm of prostate: Principal | ICD-10-CM

## 2023-04-05 DIAGNOSIS — C7951 Secondary malignant neoplasm of bone: Principal | ICD-10-CM

## 2023-04-14 DIAGNOSIS — C61 Malignant neoplasm of prostate: Principal | ICD-10-CM

## 2023-04-14 DIAGNOSIS — C7951 Secondary malignant neoplasm of bone: Principal | ICD-10-CM

## 2023-04-24 DIAGNOSIS — C7951 Secondary malignant neoplasm of bone: Principal | ICD-10-CM

## 2023-04-24 DIAGNOSIS — C61 Malignant neoplasm of prostate: Principal | ICD-10-CM

## 2023-05-12 DIAGNOSIS — C61 Malignant neoplasm of prostate: Principal | ICD-10-CM

## 2023-05-12 DIAGNOSIS — M85859 Other specified disorders of bone density and structure, unspecified thigh: Principal | ICD-10-CM

## 2023-05-12 DIAGNOSIS — Z8546 Personal history of malignant neoplasm of prostate: Principal | ICD-10-CM

## 2023-05-12 DIAGNOSIS — C7951 Secondary malignant neoplasm of bone: Principal | ICD-10-CM

## 2023-05-26 DIAGNOSIS — C61 Malignant neoplasm of prostate: Principal | ICD-10-CM

## 2023-05-26 DIAGNOSIS — C7951 Secondary malignant neoplasm of bone: Principal | ICD-10-CM

## 2023-05-29 ENCOUNTER — Inpatient Hospital Stay: Admit: 2023-05-29 | Discharge: 2023-05-30 | Payer: MEDICARE

## 2023-05-29 ENCOUNTER — Ambulatory Visit: Admit: 2023-05-29 | Discharge: 2023-05-30 | Payer: MEDICARE

## 2023-05-29 ENCOUNTER — Other Ambulatory Visit: Admit: 2023-05-29 | Discharge: 2023-05-30 | Payer: MEDICARE

## 2023-05-29 ENCOUNTER — Ambulatory Visit: Admit: 2023-05-29 | Discharge: 2023-05-30 | Payer: MEDICARE | Attending: Internal Medicine | Primary: Internal Medicine

## 2023-05-29 DIAGNOSIS — Z8546 Personal history of malignant neoplasm of prostate: Principal | ICD-10-CM

## 2023-05-29 DIAGNOSIS — C7951 Secondary malignant neoplasm of bone: Principal | ICD-10-CM

## 2023-05-29 DIAGNOSIS — C61 Malignant neoplasm of prostate: Principal | ICD-10-CM

## 2023-05-29 DIAGNOSIS — M85859 Other specified disorders of bone density and structure, unspecified thigh: Principal | ICD-10-CM

## 2023-05-30 DIAGNOSIS — C61 Malignant neoplasm of prostate: Principal | ICD-10-CM

## 2023-05-30 DIAGNOSIS — C7951 Secondary malignant neoplasm of bone: Principal | ICD-10-CM

## 2023-06-01 DIAGNOSIS — C61 Malignant neoplasm of prostate: Principal | ICD-10-CM

## 2023-06-01 DIAGNOSIS — C7951 Secondary malignant neoplasm of bone: Principal | ICD-10-CM

## 2023-06-07 DIAGNOSIS — C7951 Secondary malignant neoplasm of bone: Principal | ICD-10-CM

## 2023-06-07 DIAGNOSIS — Z192 Hormone resistant malignancy status: Principal | ICD-10-CM

## 2023-06-07 DIAGNOSIS — C61 Malignant neoplasm of prostate: Principal | ICD-10-CM

## 2023-06-07 MED ORDER — DAROLUTAMIDE 300 MG TABLET
ORAL_TABLET | Freq: Two times a day (BID) | ORAL | 11 refills | 30.00 days | Status: CP
Start: 2023-06-07 — End: ?

## 2023-08-10 DIAGNOSIS — M85859 Other specified disorders of bone density and structure, unspecified thigh: Principal | ICD-10-CM

## 2023-08-10 DIAGNOSIS — Z8546 Personal history of malignant neoplasm of prostate: Principal | ICD-10-CM

## 2023-08-10 DIAGNOSIS — C61 Malignant neoplasm of prostate: Principal | ICD-10-CM

## 2023-08-10 DIAGNOSIS — C7951 Secondary malignant neoplasm of bone: Principal | ICD-10-CM

## 2023-08-15 DIAGNOSIS — C61 Malignant neoplasm of prostate: Principal | ICD-10-CM

## 2023-08-15 DIAGNOSIS — C7951 Secondary malignant neoplasm of bone: Principal | ICD-10-CM

## 2023-08-21 ENCOUNTER — Ambulatory Visit: Admit: 2023-08-21 | Discharge: 2023-08-21 | Payer: MEDICARE

## 2023-08-21 ENCOUNTER — Other Ambulatory Visit: Admit: 2023-08-21 | Discharge: 2023-08-21 | Payer: MEDICARE

## 2023-08-21 ENCOUNTER — Ambulatory Visit: Admit: 2023-08-21 | Discharge: 2023-08-21 | Payer: MEDICARE | Attending: Internal Medicine | Primary: Internal Medicine

## 2023-08-21 DIAGNOSIS — C61 Malignant neoplasm of prostate: Principal | ICD-10-CM

## 2023-08-21 DIAGNOSIS — C7951 Secondary malignant neoplasm of bone: Principal | ICD-10-CM

## 2023-08-21 DIAGNOSIS — Z8546 Personal history of malignant neoplasm of prostate: Principal | ICD-10-CM

## 2023-08-21 DIAGNOSIS — M85859 Other specified disorders of bone density and structure, unspecified thigh: Principal | ICD-10-CM

## 2023-08-22 DIAGNOSIS — C61 Malignant neoplasm of prostate: Principal | ICD-10-CM

## 2023-08-22 DIAGNOSIS — C7951 Secondary malignant neoplasm of bone: Principal | ICD-10-CM

## 2023-08-23 DIAGNOSIS — C61 Malignant neoplasm of prostate: Principal | ICD-10-CM

## 2023-08-23 DIAGNOSIS — C7951 Secondary malignant neoplasm of bone: Principal | ICD-10-CM

## 2023-08-30 ENCOUNTER — Inpatient Hospital Stay: Admit: 2023-08-30 | Discharge: 2023-08-31 | Payer: MEDICARE

## 2023-08-30 ENCOUNTER — Ambulatory Visit: Admit: 2023-08-30 | Discharge: 2023-08-31 | Payer: MEDICARE

## 2023-08-31 ENCOUNTER — Inpatient Hospital Stay: Admit: 2023-08-31 | Discharge: 2023-09-05 | Payer: MEDICARE

## 2023-08-31 ENCOUNTER — Inpatient Hospital Stay: Admit: 2023-08-31 | Discharge: 2023-09-01 | Payer: MEDICARE

## 2023-08-31 ENCOUNTER — Inpatient Hospital Stay: Admit: 2023-08-31 | Discharge: 2023-09-20 | Payer: MEDICARE

## 2023-09-01 DIAGNOSIS — C61 Malignant neoplasm of prostate: Principal | ICD-10-CM

## 2023-09-04 ENCOUNTER — Inpatient Hospital Stay: Admit: 2023-09-04 | Discharge: 2023-09-05 | Payer: MEDICARE

## 2023-09-04 ENCOUNTER — Ambulatory Visit: Admit: 2023-09-04 | Discharge: 2023-09-05 | Payer: MEDICARE

## 2023-09-04 ENCOUNTER — Other Ambulatory Visit: Admit: 2023-09-04 | Discharge: 2023-09-05 | Payer: MEDICARE

## 2023-09-04 DIAGNOSIS — Z8546 Personal history of malignant neoplasm of prostate: Principal | ICD-10-CM

## 2023-09-04 DIAGNOSIS — C61 Malignant neoplasm of prostate: Principal | ICD-10-CM

## 2023-09-04 DIAGNOSIS — C7951 Secondary malignant neoplasm of bone: Principal | ICD-10-CM

## 2023-09-04 DIAGNOSIS — M85859 Other specified disorders of bone density and structure, unspecified thigh: Principal | ICD-10-CM

## 2023-09-05 DIAGNOSIS — C61 Malignant neoplasm of prostate: Principal | ICD-10-CM

## 2023-09-05 DIAGNOSIS — C7951 Secondary malignant neoplasm of bone: Principal | ICD-10-CM

## 2023-09-06 DIAGNOSIS — C61 Malignant neoplasm of prostate: Principal | ICD-10-CM

## 2023-09-06 DIAGNOSIS — C7951 Secondary malignant neoplasm of bone: Principal | ICD-10-CM

## 2023-09-21 DIAGNOSIS — C61 Malignant neoplasm of prostate: Principal | ICD-10-CM

## 2023-09-21 DIAGNOSIS — C7951 Secondary malignant neoplasm of bone: Principal | ICD-10-CM

## 2023-10-02 ENCOUNTER — Inpatient Hospital Stay: Admit: 2023-10-02 | Discharge: 2023-10-11 | Payer: MEDICARE

## 2023-10-02 ENCOUNTER — Inpatient Hospital Stay: Admit: 2023-10-02 | Discharge: 2023-10-06 | Payer: MEDICARE

## 2023-10-02 ENCOUNTER — Inpatient Hospital Stay: Admit: 2023-10-02 | Discharge: 2023-10-03 | Payer: MEDICARE

## 2023-10-02 ENCOUNTER — Inpatient Hospital Stay: Admit: 2023-10-02 | Discharge: 2023-10-05 | Payer: MEDICARE

## 2023-10-02 ENCOUNTER — Inpatient Hospital Stay
Admit: 2023-10-02 | Discharge: 2023-10-07 | Payer: MEDICARE | Attending: Radiation Oncology | Primary: Radiation Oncology

## 2023-10-02 ENCOUNTER — Inpatient Hospital Stay: Admit: 2023-10-02 | Discharge: 2023-10-10 | Payer: MEDICARE

## 2023-10-26 DIAGNOSIS — C61 Malignant neoplasm of prostate: Principal | ICD-10-CM

## 2023-10-26 DIAGNOSIS — C7951 Secondary malignant neoplasm of bone: Principal | ICD-10-CM

## 2023-10-31 ENCOUNTER — Ambulatory Visit: Admit: 2023-10-31 | Payer: MEDICARE

## 2023-10-31 DIAGNOSIS — C61 Malignant neoplasm of prostate: Principal | ICD-10-CM

## 2023-10-31 DIAGNOSIS — C7951 Secondary malignant neoplasm of bone: Principal | ICD-10-CM

## 2023-11-06 DIAGNOSIS — C61 Malignant neoplasm of prostate: Principal | ICD-10-CM

## 2023-11-06 DIAGNOSIS — C7951 Secondary malignant neoplasm of bone: Principal | ICD-10-CM

## 2023-11-06 MED ORDER — OLANZAPINE 5 MG TABLET
ORAL_TABLET | Freq: Every evening | ORAL | 0 refills | 30.00000 days | Status: CP
Start: 2023-11-06 — End: 2024-11-05

## 2023-12-01 MED ORDER — OLANZAPINE 5 MG TABLET
ORAL_TABLET | Freq: Every evening | ORAL | 1 refills | 0.00000 days
Start: 2023-12-01 — End: ?

## 2023-12-02 DIAGNOSIS — C61 Malignant neoplasm of prostate: Principal | ICD-10-CM

## 2023-12-02 DIAGNOSIS — C7951 Secondary malignant neoplasm of bone: Principal | ICD-10-CM

## 2023-12-02 MED ORDER — OLANZAPINE 5 MG TABLET
ORAL_TABLET | Freq: Every evening | ORAL | 1 refills | 90.00000 days | Status: CP
Start: 2023-12-02 — End: ?

## 2023-12-05 ENCOUNTER — Inpatient Hospital Stay: Admit: 2023-12-05 | Discharge: 2023-12-05 | Payer: MEDICARE

## 2023-12-05 ENCOUNTER — Other Ambulatory Visit: Admit: 2023-12-05 | Discharge: 2023-12-05 | Payer: MEDICARE

## 2023-12-05 ENCOUNTER — Ambulatory Visit: Admit: 2023-12-05 | Discharge: 2023-12-05 | Payer: MEDICARE

## 2023-12-05 DIAGNOSIS — Z8546 Personal history of malignant neoplasm of prostate: Principal | ICD-10-CM

## 2023-12-05 DIAGNOSIS — C61 Malignant neoplasm of prostate: Principal | ICD-10-CM

## 2023-12-05 DIAGNOSIS — C7951 Secondary malignant neoplasm of bone: Principal | ICD-10-CM

## 2023-12-06 DIAGNOSIS — C61 Malignant neoplasm of prostate: Principal | ICD-10-CM

## 2023-12-06 DIAGNOSIS — C7951 Secondary malignant neoplasm of bone: Principal | ICD-10-CM

## 2024-02-04 DIAGNOSIS — C61 Malignant neoplasm of prostate: Principal | ICD-10-CM

## 2024-02-04 DIAGNOSIS — C7951 Secondary malignant neoplasm of bone: Principal | ICD-10-CM

## 2024-02-23 DIAGNOSIS — C61 Malignant neoplasm of prostate: Principal | ICD-10-CM

## 2024-02-23 DIAGNOSIS — C7951 Secondary malignant neoplasm of bone: Principal | ICD-10-CM

## 2024-03-03 DIAGNOSIS — C7951 Secondary malignant neoplasm of bone: Principal | ICD-10-CM

## 2024-03-03 DIAGNOSIS — C61 Malignant neoplasm of prostate: Principal | ICD-10-CM

## 2024-03-03 DIAGNOSIS — Z8546 Personal history of malignant neoplasm of prostate: Principal | ICD-10-CM

## 2024-03-05 ENCOUNTER — Ambulatory Visit: Admit: 2024-03-05 | Discharge: 2024-03-05 | Payer: MEDICARE

## 2024-03-05 ENCOUNTER — Other Ambulatory Visit: Admit: 2024-03-05 | Discharge: 2024-03-05 | Payer: MEDICARE

## 2024-03-05 ENCOUNTER — Inpatient Hospital Stay: Admit: 2024-03-05 | Discharge: 2024-03-05 | Payer: MEDICARE

## 2024-03-05 DIAGNOSIS — C7951 Secondary malignant neoplasm of bone: Principal | ICD-10-CM

## 2024-03-05 DIAGNOSIS — C61 Malignant neoplasm of prostate: Principal | ICD-10-CM

## 2024-03-05 DIAGNOSIS — M85859 Other specified disorders of bone density and structure, unspecified thigh: Principal | ICD-10-CM

## 2024-03-05 DIAGNOSIS — Z8546 Personal history of malignant neoplasm of prostate: Principal | ICD-10-CM

## 2024-03-06 DIAGNOSIS — C7951 Secondary malignant neoplasm of bone: Principal | ICD-10-CM

## 2024-03-06 DIAGNOSIS — C61 Malignant neoplasm of prostate: Principal | ICD-10-CM

## 2024-03-12 DIAGNOSIS — C61 Malignant neoplasm of prostate: Principal | ICD-10-CM

## 2024-03-12 DIAGNOSIS — C7951 Secondary malignant neoplasm of bone: Principal | ICD-10-CM

## 2024-03-12 DIAGNOSIS — Z192 Hormone resistant malignancy status: Principal | ICD-10-CM

## 2024-03-12 MED ORDER — DAROLUTAMIDE 300 MG TABLET
ORAL_TABLET | Freq: Two times a day (BID) | ORAL | 11 refills | 30.00000 days | Status: CP
Start: 2024-03-12 — End: 2024-03-12

## 2024-05-11 DIAGNOSIS — C61 Malignant neoplasm of prostate: Principal | ICD-10-CM

## 2024-05-11 DIAGNOSIS — C7951 Secondary malignant neoplasm of bone: Principal | ICD-10-CM

## 2024-05-11 DIAGNOSIS — M85859 Other specified disorders of bone density and structure, unspecified thigh: Principal | ICD-10-CM

## 2024-05-11 DIAGNOSIS — Z8546 Personal history of malignant neoplasm of prostate: Principal | ICD-10-CM

## 2024-05-31 ENCOUNTER — Ambulatory Visit: Admit: 2024-05-31 | Payer: MEDICARE

## 2024-06-02 DIAGNOSIS — C7951 Secondary malignant neoplasm of bone: Secondary | ICD-10-CM

## 2024-06-02 DIAGNOSIS — M85859 Other specified disorders of bone density and structure, unspecified thigh: Secondary | ICD-10-CM

## 2024-06-02 DIAGNOSIS — C61 Malignant neoplasm of prostate: Secondary | ICD-10-CM

## 2024-06-02 DIAGNOSIS — Z8546 Personal history of malignant neoplasm of prostate: Principal | ICD-10-CM

## 2024-06-06 ENCOUNTER — Other Ambulatory Visit: Admit: 2024-06-06 | Payer: MEDICARE

## 2024-06-06 ENCOUNTER — Ambulatory Visit: Admit: 2024-06-06 | Payer: MEDICARE

## 2024-06-06 ENCOUNTER — Ambulatory Visit: Admit: 2024-06-06 | Payer: MEDICARE | Attending: Internal Medicine | Primary: Internal Medicine

## 2024-06-06 DIAGNOSIS — Z192 Hormone resistant malignancy status: Secondary | ICD-10-CM

## 2024-06-06 DIAGNOSIS — E291 Testicular hypofunction: Principal | ICD-10-CM

## 2024-06-06 DIAGNOSIS — Z8546 Personal history of malignant neoplasm of prostate: Principal | ICD-10-CM

## 2024-06-06 DIAGNOSIS — C7951 Secondary malignant neoplasm of bone: Secondary | ICD-10-CM

## 2024-06-06 DIAGNOSIS — C61 Malignant neoplasm of prostate: Secondary | ICD-10-CM
